# Patient Record
Sex: Male | Born: 1955 | Race: White | Hispanic: No | Marital: Married | Smoking: Former smoker
Health system: Southern US, Community
[De-identification: ages and names within clinical notes are randomized; demographics above are authoritative.]

## PROBLEM LIST (undated history)

## (undated) DIAGNOSIS — E119 Type 2 diabetes mellitus without complications: Secondary | ICD-10-CM

## (undated) DIAGNOSIS — R109 Unspecified abdominal pain: Secondary | ICD-10-CM

## (undated) DIAGNOSIS — G8929 Other chronic pain: Secondary | ICD-10-CM

## (undated) DIAGNOSIS — J449 Chronic obstructive pulmonary disease, unspecified: Secondary | ICD-10-CM

## (undated) DIAGNOSIS — K220 Achalasia of cardia: Secondary | ICD-10-CM

## (undated) DIAGNOSIS — E7800 Pure hypercholesterolemia, unspecified: Secondary | ICD-10-CM

## (undated) DIAGNOSIS — K219 Gastro-esophageal reflux disease without esophagitis: Secondary | ICD-10-CM

## (undated) DIAGNOSIS — F419 Anxiety disorder, unspecified: Secondary | ICD-10-CM

## (undated) DIAGNOSIS — IMO0002 Reserved for concepts with insufficient information to code with codable children: Secondary | ICD-10-CM

## (undated) HISTORY — DX: Achalasia of cardia: K22.0

## (undated) HISTORY — DX: Anxiety disorder, unspecified: F41.9

## (undated) HISTORY — DX: Chronic obstructive pulmonary disease, unspecified: J44.9

## (undated) HISTORY — DX: Unspecified abdominal pain: R10.9

## (undated) HISTORY — PX: HELLER MYOTOMY: SHX5259

## (undated) HISTORY — PX: ESOPHAGOGASTRODUODENOSCOPY: SHX1529

## (undated) HISTORY — PX: COLONOSCOPY: SHX174

## (undated) HISTORY — DX: Reserved for concepts with insufficient information to code with codable children: IMO0002

## (undated) HISTORY — DX: Gastro-esophageal reflux disease without esophagitis: K21.9

## (undated) HISTORY — DX: Other chronic pain: G89.29

## (undated) HISTORY — DX: Pure hypercholesterolemia, unspecified: E78.00

## (undated) HISTORY — PX: HEMORRHOID SURGERY: SHX153

---

## 2001-11-12 ENCOUNTER — Encounter: Payer: Self-pay | Admitting: Family Medicine

## 2001-11-12 ENCOUNTER — Ambulatory Visit (HOSPITAL_COMMUNITY): Admission: RE | Admit: 2001-11-12 | Discharge: 2001-11-12 | Payer: Self-pay | Admitting: Family Medicine

## 2004-06-29 ENCOUNTER — Ambulatory Visit (HOSPITAL_COMMUNITY): Admission: RE | Admit: 2004-06-29 | Discharge: 2004-06-29 | Payer: Self-pay | Admitting: Orthopedic Surgery

## 2007-07-20 ENCOUNTER — Emergency Department (HOSPITAL_COMMUNITY): Admission: EM | Admit: 2007-07-20 | Discharge: 2007-07-20 | Payer: Self-pay | Admitting: Emergency Medicine

## 2007-07-31 ENCOUNTER — Ambulatory Visit (HOSPITAL_COMMUNITY): Admission: RE | Admit: 2007-07-31 | Discharge: 2007-07-31 | Payer: Self-pay | Admitting: Family Medicine

## 2007-08-03 ENCOUNTER — Ambulatory Visit: Payer: Self-pay | Admitting: Urgent Care

## 2007-08-03 ENCOUNTER — Ambulatory Visit: Payer: Self-pay | Admitting: Cardiovascular Disease

## 2007-08-05 ENCOUNTER — Encounter: Payer: Self-pay | Admitting: Internal Medicine

## 2007-08-05 ENCOUNTER — Ambulatory Visit: Payer: Self-pay | Admitting: Cardiology

## 2007-08-05 ENCOUNTER — Observation Stay (HOSPITAL_COMMUNITY): Admission: EM | Admit: 2007-08-05 | Discharge: 2007-08-05 | Payer: Self-pay | Admitting: Internal Medicine

## 2007-08-06 ENCOUNTER — Inpatient Hospital Stay (HOSPITAL_COMMUNITY): Admission: EM | Admit: 2007-08-06 | Discharge: 2007-08-09 | Payer: Self-pay | Admitting: Emergency Medicine

## 2007-08-07 ENCOUNTER — Ambulatory Visit: Payer: Self-pay | Admitting: Internal Medicine

## 2007-08-08 ENCOUNTER — Ambulatory Visit: Payer: Self-pay | Admitting: Gastroenterology

## 2007-10-06 ENCOUNTER — Ambulatory Visit: Payer: Self-pay | Admitting: Gastroenterology

## 2007-11-26 ENCOUNTER — Ambulatory Visit: Payer: Self-pay | Admitting: Gastroenterology

## 2007-11-27 ENCOUNTER — Emergency Department (HOSPITAL_COMMUNITY): Admission: EM | Admit: 2007-11-27 | Discharge: 2007-11-27 | Payer: Self-pay | Admitting: Emergency Medicine

## 2007-12-02 ENCOUNTER — Ambulatory Visit: Payer: Self-pay | Admitting: Gastroenterology

## 2007-12-03 ENCOUNTER — Ambulatory Visit (HOSPITAL_COMMUNITY): Admission: RE | Admit: 2007-12-03 | Discharge: 2007-12-03 | Payer: Self-pay | Admitting: Gastroenterology

## 2007-12-03 ENCOUNTER — Ambulatory Visit: Payer: Self-pay | Admitting: Gastroenterology

## 2008-01-27 ENCOUNTER — Ambulatory Visit: Payer: Self-pay | Admitting: Gastroenterology

## 2008-02-22 ENCOUNTER — Ambulatory Visit (HOSPITAL_COMMUNITY): Admission: RE | Admit: 2008-02-22 | Discharge: 2008-02-22 | Payer: Self-pay | Admitting: Family Medicine

## 2008-02-28 ENCOUNTER — Emergency Department (HOSPITAL_COMMUNITY): Admission: EM | Admit: 2008-02-28 | Discharge: 2008-02-28 | Payer: Self-pay | Admitting: Emergency Medicine

## 2008-03-05 ENCOUNTER — Emergency Department (HOSPITAL_COMMUNITY): Admission: EM | Admit: 2008-03-05 | Discharge: 2008-03-05 | Payer: Self-pay | Admitting: Emergency Medicine

## 2008-03-10 ENCOUNTER — Ambulatory Visit: Payer: Self-pay | Admitting: Orthopedic Surgery

## 2008-03-10 DIAGNOSIS — C801 Malignant (primary) neoplasm, unspecified: Secondary | ICD-10-CM | POA: Insufficient documentation

## 2008-03-11 ENCOUNTER — Telehealth: Payer: Self-pay | Admitting: Orthopedic Surgery

## 2008-03-21 ENCOUNTER — Encounter: Payer: Self-pay | Admitting: Orthopedic Surgery

## 2008-03-24 ENCOUNTER — Encounter (INDEPENDENT_AMBULATORY_CARE_PROVIDER_SITE_OTHER): Payer: Self-pay | Admitting: Orthopedic Surgery

## 2008-03-24 ENCOUNTER — Ambulatory Visit (HOSPITAL_BASED_OUTPATIENT_CLINIC_OR_DEPARTMENT_OTHER): Admission: RE | Admit: 2008-03-24 | Discharge: 2008-03-24 | Payer: Self-pay | Admitting: Orthopedic Surgery

## 2008-03-31 ENCOUNTER — Encounter: Payer: Self-pay | Admitting: Orthopedic Surgery

## 2008-04-28 ENCOUNTER — Ambulatory Visit (HOSPITAL_COMMUNITY): Admission: RE | Admit: 2008-04-28 | Discharge: 2008-04-28 | Payer: Self-pay | Admitting: Orthopedic Surgery

## 2008-06-20 ENCOUNTER — Ambulatory Visit: Payer: Self-pay | Admitting: Gastroenterology

## 2008-06-21 ENCOUNTER — Ambulatory Visit (HOSPITAL_COMMUNITY): Admission: RE | Admit: 2008-06-21 | Discharge: 2008-06-21 | Payer: Self-pay | Admitting: Gastroenterology

## 2008-08-25 ENCOUNTER — Ambulatory Visit: Payer: Self-pay | Admitting: Internal Medicine

## 2008-08-25 ENCOUNTER — Encounter: Payer: Self-pay | Admitting: Urgent Care

## 2008-08-25 LAB — CONVERTED CEMR LAB
ALT: 24 units/L (ref 0–53)
Alkaline Phosphatase: 82 units/L (ref 39–117)
Basophils Absolute: 0.1 10*3/uL (ref 0.0–0.1)
Bilirubin, Direct: 0.1 mg/dL (ref 0.0–0.3)
Eosinophils Absolute: 0.5 10*3/uL (ref 0.0–0.7)
HCT: 42 % (ref 39.0–52.0)
Hemoglobin: 13.8 g/dL (ref 13.0–17.0)
Indirect Bilirubin: 0.6 mg/dL (ref 0.0–0.9)
Lymphocytes Relative: 25 % (ref 12–46)
Lymphs Abs: 1.3 10*3/uL (ref 0.7–4.0)
MCHC: 32.9 g/dL (ref 30.0–36.0)
MCV: 89.2 fL (ref 78.0–100.0)
Neutro Abs: 2.9 10*3/uL (ref 1.7–7.7)
Neutrophils Relative %: 58 % (ref 43–77)
Total Protein: 7.6 g/dL (ref 6.0–8.3)
WBC: 5.1 10*3/uL (ref 4.0–10.5)

## 2008-09-02 ENCOUNTER — Encounter (HOSPITAL_COMMUNITY): Admission: RE | Admit: 2008-09-02 | Discharge: 2008-10-02 | Payer: Self-pay | Admitting: Gastroenterology

## 2008-09-14 ENCOUNTER — Ambulatory Visit: Payer: Self-pay | Admitting: Gastroenterology

## 2008-09-14 ENCOUNTER — Ambulatory Visit (HOSPITAL_COMMUNITY): Admission: RE | Admit: 2008-09-14 | Discharge: 2008-09-14 | Payer: Self-pay | Admitting: Gastroenterology

## 2008-09-14 ENCOUNTER — Encounter: Payer: Self-pay | Admitting: Gastroenterology

## 2008-10-05 ENCOUNTER — Encounter (HOSPITAL_COMMUNITY): Admission: RE | Admit: 2008-10-05 | Discharge: 2008-11-04 | Payer: Self-pay | Admitting: Oncology

## 2008-10-05 ENCOUNTER — Ambulatory Visit (HOSPITAL_COMMUNITY): Payer: Self-pay | Admitting: Oncology

## 2008-10-07 ENCOUNTER — Ambulatory Visit (HOSPITAL_COMMUNITY): Admission: RE | Admit: 2008-10-07 | Discharge: 2008-10-07 | Payer: Self-pay | Admitting: Family Medicine

## 2008-12-07 ENCOUNTER — Encounter: Payer: Self-pay | Admitting: Gastroenterology

## 2008-12-19 ENCOUNTER — Encounter: Payer: Self-pay | Admitting: Gastroenterology

## 2009-02-21 ENCOUNTER — Encounter: Payer: Self-pay | Admitting: Gastroenterology

## 2009-03-07 DIAGNOSIS — K219 Gastro-esophageal reflux disease without esophagitis: Secondary | ICD-10-CM | POA: Insufficient documentation

## 2009-03-07 DIAGNOSIS — R109 Unspecified abdominal pain: Secondary | ICD-10-CM

## 2009-03-07 DIAGNOSIS — K2940 Chronic atrophic gastritis without bleeding: Secondary | ICD-10-CM | POA: Insufficient documentation

## 2009-03-07 DIAGNOSIS — D649 Anemia, unspecified: Secondary | ICD-10-CM

## 2009-03-07 DIAGNOSIS — R110 Nausea: Secondary | ICD-10-CM

## 2009-03-07 DIAGNOSIS — R1013 Epigastric pain: Secondary | ICD-10-CM

## 2009-04-20 ENCOUNTER — Ambulatory Visit: Payer: Self-pay | Admitting: Gastroenterology

## 2009-04-20 LAB — CONVERTED CEMR LAB
AST: 27 units/L (ref 0–37)
Albumin: 3.8 g/dL (ref 3.5–5.2)
Alkaline Phosphatase: 81 units/L (ref 39–117)
BUN: 16 mg/dL (ref 6–23)
Chloride: 107 meq/L (ref 96–112)
Creatinine, Ser: 1 mg/dL (ref 0.40–1.50)
Glucose, Bld: 94 mg/dL (ref 70–99)
Lipase: 34 units/L (ref 0–75)
Total Bilirubin: 0.4 mg/dL (ref 0.3–1.2)

## 2009-04-21 ENCOUNTER — Ambulatory Visit (HOSPITAL_COMMUNITY): Admission: RE | Admit: 2009-04-21 | Discharge: 2009-04-21 | Payer: Self-pay | Admitting: Gastroenterology

## 2009-04-21 ENCOUNTER — Telehealth: Payer: Self-pay | Admitting: Gastroenterology

## 2009-04-24 ENCOUNTER — Encounter (INDEPENDENT_AMBULATORY_CARE_PROVIDER_SITE_OTHER): Payer: Self-pay | Admitting: *Deleted

## 2009-04-25 ENCOUNTER — Encounter: Payer: Self-pay | Admitting: Gastroenterology

## 2009-04-25 ENCOUNTER — Ambulatory Visit (HOSPITAL_COMMUNITY): Admission: RE | Admit: 2009-04-25 | Discharge: 2009-04-25 | Payer: Self-pay | Admitting: Gastroenterology

## 2009-04-27 ENCOUNTER — Ambulatory Visit: Payer: Self-pay | Admitting: Gastroenterology

## 2009-04-27 ENCOUNTER — Telehealth: Payer: Self-pay | Admitting: Gastroenterology

## 2009-05-02 ENCOUNTER — Telehealth (INDEPENDENT_AMBULATORY_CARE_PROVIDER_SITE_OTHER): Payer: Self-pay

## 2009-11-28 ENCOUNTER — Encounter (INDEPENDENT_AMBULATORY_CARE_PROVIDER_SITE_OTHER): Payer: Self-pay

## 2010-01-01 ENCOUNTER — Encounter: Payer: Self-pay | Admitting: Urgent Care

## 2010-01-05 ENCOUNTER — Encounter (INDEPENDENT_AMBULATORY_CARE_PROVIDER_SITE_OTHER): Payer: Self-pay | Admitting: *Deleted

## 2010-04-30 ENCOUNTER — Emergency Department (HOSPITAL_COMMUNITY): Admission: EM | Admit: 2010-04-30 | Discharge: 2010-04-30 | Payer: Self-pay | Admitting: Emergency Medicine

## 2010-08-28 ENCOUNTER — Encounter: Payer: Self-pay | Admitting: Gastroenterology

## 2010-09-06 ENCOUNTER — Encounter (INDEPENDENT_AMBULATORY_CARE_PROVIDER_SITE_OTHER): Payer: Self-pay | Admitting: *Deleted

## 2010-09-25 NOTE — Letter (Signed)
Summary: Recall Colonoscopy/Endoscopy, Change to Office Visit  [facility]  [address]   [place], [place] [address]   Phone: [phone]  Fax: [phone]      November 28, 2009   Walter Herring [address] [place], [place]  [address] Feb 26, 1956   Dear [name],   According to our records, it is time for you to schedule an Endoscopy. However, after reviewing your medical record, we recommend an office visit in order to determine your need for a repeat procedure.  Please call [phone] at your convenience to schedule an office visit. If you have any questions or concerns, please feel free to contact our office.   Sincerely,   Cloria Spring LPN  [facility] Ph: [phone]   Fax: [phone]

## 2010-09-25 NOTE — Letter (Signed)
Summary: Recall Office Visit  [facility]  [address]   [place], [place] [address]   Phone: [phone]  Fax: [phone]      Jan 05, 2010   SHAROD PETSCH [address] [place], [place]  [address] 1956-05-05   Dear Mr. Fretz,   According to our records, it is time for you to schedule a follow-up office visit with [place].   At your convenience, please call [phone] to schedule an office visit. If you have any questions, concerns, or feel that this letter is in error, we would appreciate your call.   Sincerely,    Diana Eves  [facility] Ph: [phone]   Fax: [phone]

## 2010-09-25 NOTE — Medication Information (Signed)
Summary: RX Folder  RX Folder   Imported By: Peggyann Shoals 01/01/2010 14:38:01  _____________________________________________________________________  External Attachment:    Type:   Image     Comment:   External Document  Appended Document: RX Folder He is supposed to be on paroxetine per our records, unless changed elsewhere, this is correct.  He needs OV.  Appended Document: RX Folder mailed letter

## 2010-09-27 NOTE — Medication Information (Signed)
Summary: NAPROXEN  500MG   NAPROXEN  500MG    Imported By: Rexene Alberts 08/28/2010 12:23:32  _____________________________________________________________________  External Attachment:    Type:   Image     Comment:   External Document  Appended Document: NAPROXEN  500MG  please inform pharmacy to forward to PCP>   Appended Document: NAPROXEN  500MG  left message at pharmacy that PCP was Dr. [name]

## 2010-09-27 NOTE — Letter (Signed)
Summary: Recall Office Visit  [facility]  [address]   [place], [place] [address]   Phone: [phone]  Fax: [phone]      September 06, 2010   DAMARKO STITELY [address] [place], [place]  [address] 1956-05-09   Dear Mr. Younge,   According to our records, it is time for you to schedule a follow-up office visit with [place].   At your convenience, please call [phone] to schedule an office visit. If you have any questions, concerns, or feel that this letter is in error, we would appreciate your call.   Sincerely,    Diana Eves  [facility] Ph: [phone]   Fax: [phone]

## 2010-10-16 ENCOUNTER — Emergency Department (HOSPITAL_COMMUNITY): Payer: Medicare Other

## 2010-10-16 ENCOUNTER — Emergency Department (HOSPITAL_COMMUNITY)
Admission: EM | Admit: 2010-10-16 | Discharge: 2010-10-16 | Disposition: A | Discharge status: Home / Self Care | Payer: Medicare Other | Attending: Emergency Medicine | Admitting: Emergency Medicine

## 2010-10-16 DIAGNOSIS — M25559 Pain in unspecified hip: Secondary | ICD-10-CM | POA: Insufficient documentation

## 2010-10-16 DIAGNOSIS — M549 Dorsalgia, unspecified: Secondary | ICD-10-CM | POA: Insufficient documentation

## 2010-10-23 ENCOUNTER — Encounter: Payer: Self-pay | Admitting: Gastroenterology

## 2010-10-23 ENCOUNTER — Ambulatory Visit: Payer: Self-pay | Admitting: Gastroenterology

## 2010-10-23 ENCOUNTER — Encounter: Payer: Self-pay | Admitting: Internal Medicine

## 2010-10-23 ENCOUNTER — Ambulatory Visit (INDEPENDENT_AMBULATORY_CARE_PROVIDER_SITE_OTHER): Payer: Medicare Other | Admitting: Gastroenterology

## 2010-10-23 DIAGNOSIS — R1013 Epigastric pain: Secondary | ICD-10-CM

## 2010-10-23 DIAGNOSIS — R1319 Other dysphagia: Secondary | ICD-10-CM

## 2010-10-30 ENCOUNTER — Encounter: Payer: Medicare Other | Admitting: Internal Medicine

## 2010-10-30 ENCOUNTER — Ambulatory Visit (HOSPITAL_COMMUNITY)
Admission: RE | Admit: 2010-10-30 | Discharge: 2010-10-30 | Disposition: A | Discharge status: Home / Self Care | Payer: Medicare Other | Source: Ambulatory Visit | Attending: Internal Medicine | Admitting: Internal Medicine

## 2010-10-30 DIAGNOSIS — K2100 Gastro-esophageal reflux disease with esophagitis, without bleeding: Secondary | ICD-10-CM | POA: Insufficient documentation

## 2010-10-30 DIAGNOSIS — J4489 Other specified chronic obstructive pulmonary disease: Secondary | ICD-10-CM | POA: Insufficient documentation

## 2010-10-30 DIAGNOSIS — IMO0002 Reserved for concepts with insufficient information to code with codable children: Secondary | ICD-10-CM | POA: Insufficient documentation

## 2010-10-30 DIAGNOSIS — R1310 Dysphagia, unspecified: Secondary | ICD-10-CM

## 2010-10-30 DIAGNOSIS — J449 Chronic obstructive pulmonary disease, unspecified: Secondary | ICD-10-CM | POA: Insufficient documentation

## 2010-10-30 DIAGNOSIS — E785 Hyperlipidemia, unspecified: Secondary | ICD-10-CM | POA: Insufficient documentation

## 2010-10-30 DIAGNOSIS — T18108A Unspecified foreign body in esophagus causing other injury, initial encounter: Secondary | ICD-10-CM | POA: Insufficient documentation

## 2010-10-30 DIAGNOSIS — K222 Esophageal obstruction: Secondary | ICD-10-CM | POA: Insufficient documentation

## 2010-11-01 NOTE — Letter (Signed)
Summary: EGD/ED ORDER  EGD/ED ORDER   Imported By: Ave Filter 10/23/2010 14:35:17  _____________________________________________________________________  External Attachment:    Type:   Image     Comment:   External Document

## 2010-11-01 NOTE — Assessment & Plan Note (Signed)
Summary: VOMITING WHEN HE EATS/LAW   Vital Signs:  Patient profile:   55 year old male Height:      73 inches Weight:      190.25 pounds BMI:     25.19 Temp:     97.9 degrees F oral Pulse rate:   88 / minute BP sitting:   116 / 84  (left arm)  Vitals Entered By: Carolan Clines LPN (October 23, 2010 1:19 PM)  Visit Type:  Follow-up Visit Primary Care Provider:  Janna Herring, M.D.   History of Present Illness: Walter Herring is a 55 year old Caucasian male with a hx significant for achalasia, s/p Heller myotomy in December 2008. Last EGD in Jan 2010-->gastritis. Last visit with our office August 2010 with a wt of 208. Presents today with 18 lb wt loss, down to 190.    presents with a 6-7 mos hx of progressive dysphagia and epigastric pain with eating. Described as "busting chest open". Occasional nausea. Only relief is to regurgitate food.  Reports several  incidences of melena over the past few months. Also has noted small amount of bright red blood with regurgitation on several occasions. Med list reports Naproxen, but pt is unsure if he is taking this or not.  Taking Goodys every day X 2-3 months secondary to epigastric pain. Hasn't been taking omeprazole as prescribed because didn't have refills.  Decreased appetite due to epigastric pain.  Current Medications (verified): 1)  Hydrocodone .... As Needed 2)  Promethazine Hcl 25 Mg Tabs (Promethazine Hcl) .... 1/2 To One By Mouth Every 8 Hours As Needed 3)  Norco 7.5-325 Mg  Tabs (Hydrocodone-Acetaminophen) .Marland Kitchen.. 1 Q 4 As Needed 4)  Omeprazole 20 Mg Cpdr (Omeprazole) .... One By Mouth Two Times A Day Before Breakfast and Evening Meal 5)  Naproxen 500 Mg Tabs (Naproxen) .... Take 1 Tablet By Mouth Two Times A Day 6)  Multivitamins  Tabs (Multiple Vitamin) .... Take 1 Tablet By Mouth Once A Day 7)  Pravachol 40 Mg Tabs (Pravastatin Sodium) .... Take 1 Tablet By Mouth Once A Day 8)  Aciphex .... Samples/ When Not Taking Omeprazole 9)   Lidocaine Viscous 2 % Soln (Lidocaine Hcl) .Marland Kitchen.. 10 Cc With 200 Cc Maalox or Mylanta Q4-6h As Needed Pain 10)  Fluoxetine Hcl 20 Mg Caps (Fluoxetine Hcl) .... Take 2 By Mouth Daily 11)  Omeprazole 20 Mg Tbec (Omeprazole) .... Take 1 30 Minutes Before Breakfast and 30 Minutes Before Dinner  Allergies (verified): No Known Drug Allergies  Past History:  Past Medical History: bulging disc in back DDD GERD COPD Hypercholesterolemia Anxiety  Past Surgical History: Heller Myotomy EGD/ Elease Hashimoto dilation 2008 EGD Jan 2010-->gastritis  hemorrhoidectomy Teeth extraction 2009  TCS 2009-NORMAL  Family History: Reviewed history from 04/20/2009 and no changes required. FH of Cancer:  Family History Coronary Heart Disease male < 21 Family History of Arthritis No FH of Colon Cancer or polyps  Social History: Patient is married X 20+ years disabled Chews tobacco, no smoking X 15 years Alcohol Use - no  Review of Systems General:  Denies fever, chills, and anorexia. Eyes:  Denies blurring, irritation, and discharge. ENT:  Denies sore throat, hoarseness, and difficulty swallowing. CV:  Denies chest pains and syncope. Resp:  Denies dyspnea at rest and wheezing. GI:  See HPI. GU:  Denies urinary burning and urinary frequency. MS:  Denies joint pain / LOM, joint swelling, and joint stiffness. Derm:  Denies rash, itching, and dry skin. Neuro:  Denies weakness  and syncope. Psych:  Denies depression and anxiety. Endo:  Denies cold intolerance and heat intolerance. Heme:  Denies bruising and bleeding.  Physical Exam  General:  Well developed, well nourished, no acute distress. Head:  Normocephalic and atraumatic. Eyes:  PERRLA, no icterus. Lungs:  Clear throughout to auscultation. Heart:  Regular rate and rhythm; no murmurs, rubs,  or bruits. Abdomen:  +BS, soft, non-tender, non-distended. no HSM, no rebound or guarding Msk:  Symmetrical with no gross deformities. Normal  posture. Neurologic:  Alert and  oriented x4;  grossly normal neurologically. Skin:  Intact without significant lesions or rashes. Psych:  Alert and cooperative. Normal mood and affect.   Impression & Recommendations:  Problem # 1:  EPIGASTRIC PAIN (ICD-64.70) 55 year old Caucasian male with significant hx of achalasia, s/p Heller myotomy in Dec 2008, hx of gastritis on EGD 2010. Now presents with 6-7 mos hx of progressive dysphagia and epigastric pain with eating, feels like "busting chest open". Occasional nausea. Only relief is regurgitating food. +melena over last few months, small amount of brb with emesis on several occasions. Unsure if taking naproxen, but does report goody's powders every day for past several months. No PPI, ran out of refills. 18 lb wt loss since Aug 2010. question gastritis vs PUD secondary to goodys, warrants further investigation endoscopically due to hx.  EGD/ED with Dr. Jena Gauss in near future: the R/B/A have been discussed in detail with pt; he desires to proceed and states understanding. Resume Prilosec 20 mg one 30 minutes before breakfast and 30 before dinner CBC for baseline F/U after EGD  T-CBC w/Diff ([id number])  Problem # 2:  DYSPHAGIA (FAO-130.86)  See # 1  Orders: Est. Patient Level II (57846) Prescriptions: OMEPRAZOLE 20 MG TBEC (OMEPRAZOLE) take 1 30 minutes before breakfast and 30 minutes before dinner  #62 x 1   Entered and Authorized by:   Gerrit Halls NP   Signed by:   Gerrit Halls NP on 10/23/2010   Method used:   Faxed to ...       [facility] (retail)       [address]       [place], [place]  [address]       Ph: [phone]       Fax: [phone]   RxID:   [id number]    Orders Added: 1)  T-CBC w/Diff [[id number]] 2)  Est. Patient Level II [66063]

## 2010-11-05 NOTE — Op Note (Signed)
NAME:  Walter Herring, Walter Herring                ACCOUNT NO.:  [id number]  MEDICAL RECORD NO.:  [id number]           PATIENT TYPE:  O  LOCATION:  DAYP                          FACILITY:  [facility]  PHYSICIAN:  [name], M.D. DATE OF BIRTH:  04-23-1956  DATE OF PROCEDURE:  10/30/2010 DATE OF DISCHARGE:                              OPERATIVE REPORT   INDICATIONS FOR PROCEDURE:  A 55 year old gentleman with history of achalasia, status post esophagomyotomy and antireflux procedure with progressive esophageal dysphagia to solids, some liquids and retrosternal chest discomfort only recently got back on acid suppression with omeprazole 20 mg orally daily.  EGD is now being done potential for esophageal dilation, etc.  I reviewed risks, benefits, limitations, alternatives and imponderables have been discussed, questions answered. Please see the documentation in the medical record.  PROCEDURE NOTE:  O2 saturation, blood pressure, pulse and respirationswere monitored throughout the entire procedure.  CONSCIOUS SEDATION: 1. Versed 7 mg IV. 2. Demerol 125 mg IV in divided doses.  INSTRUMENT:  Pentax video chip system.  FINDINGS:  Examination of the tubular esophagus revealed a piece of meat or other type of food ball down in the distal esophagus.  I brought the tip of the scope in line with this food bolus and gently applied pressure and pushed it into the stomach.  There appeared to be a peptic stricture overlying distal esophageal erosions, some resistance in the passage of the diagnostic gastroscope.  There was no Barrett's esophagus or tumor.  EG junction easily traversed.  Stomach:  The stomach was otherwise empty and insufflated well with air.  Thorough examination of the gastric mucosa including retroflexed view of the proximal stomach, esophagogastric junction demonstrated evidence of prior antireflux surgery only.  Pylorus was patent, easily traversed.  Examination of the bulb and second  portion revealed no abnormalities.  THERAPEUTIC/DIAGNOSTIC MANEUVERS PERFORMED:  Scope was withdrawn.  I attempted to pass a 54-French Maloney dilator, but ran into acute resistance upon three-quarters insertion through the dilator and obtained a graduated TTS balloon and located it across the stricture and inflated to 18-mm and subsequently 20-mm held across the area of stricturing for 1 minute and took it down.  This did result in some dilation of the stricture.  Minimal bleeding without apparent complication.  The patient tolerated the procedure well and was reacted to Endoscopy.  IMPRESSION: 1. Esophageal food impaction, status post removal as described above. 2. Distal erosive reflux esophagitis with superimposed peptic     stricture, status post dilation described above. 3. Evidence of prior antireflux surgery, otherwise normal stomach,     patent pylorus, normal D1 and D2.  RECOMMENDATIONS: 1. Increase omeprazole to 20 mg orally twice daily. 2. Followup appointment with [place] in 6 weeks to reassess.  He may need a     subsequent dilation. 3. Soft diet. 4. Literature on gastroesophageal reflux provided to [name].     Jonathon Bellows, M.D.     RMR/MEDQ  D:  10/30/2010  T:  10/30/2010  Job:  [id number]  cc:   Melvyn Novas, MD Fax: [phone]  Electronically Signed by R.  Jena Gauss M.D. on 11/05/2010 10:27:22 AM

## 2010-11-06 ENCOUNTER — Encounter: Payer: Self-pay | Admitting: Gastroenterology

## 2010-11-06 LAB — CONVERTED CEMR LAB
Basophils Absolute: 0 10*3/uL (ref 0.0–0.1)
Eosinophils Relative: 6 % — ABNORMAL HIGH (ref 0–5)
HCT: 41.8 % (ref 39.0–52.0)
Lymphs Abs: 1.4 10*3/uL (ref 0.7–4.0)
MCHC: 33.5 g/dL (ref 30.0–36.0)
MCV: 87.8 fL (ref 78.0–100.0)
Neutrophils Relative %: 57 % (ref 43–77)

## 2010-11-13 ENCOUNTER — Other Ambulatory Visit: Payer: Self-pay | Admitting: Neurological Surgery

## 2010-11-13 DIAGNOSIS — G060 Intracranial abscess and granuloma: Secondary | ICD-10-CM

## 2010-11-13 DIAGNOSIS — M5410 Radiculopathy, site unspecified: Secondary | ICD-10-CM

## 2010-11-13 DIAGNOSIS — M545 Low back pain: Secondary | ICD-10-CM

## 2010-11-15 ENCOUNTER — Other Ambulatory Visit: Payer: Medicare Other

## 2010-11-15 ENCOUNTER — Inpatient Hospital Stay: Admission: RE | Admit: 2010-11-15 | Payer: Medicare Other | Source: Ambulatory Visit

## 2010-11-21 ENCOUNTER — Ambulatory Visit
Admission: RE | Admit: 2010-11-21 | Discharge: 2010-11-21 | Disposition: A | Discharge status: Home / Self Care | Payer: Medicare Other | Source: Ambulatory Visit | Attending: Neurological Surgery | Admitting: Neurological Surgery

## 2010-11-21 DIAGNOSIS — M545 Low back pain: Secondary | ICD-10-CM

## 2010-11-21 DIAGNOSIS — G060 Intracranial abscess and granuloma: Secondary | ICD-10-CM

## 2010-11-21 DIAGNOSIS — M5410 Radiculopathy, site unspecified: Secondary | ICD-10-CM

## 2010-11-23 ENCOUNTER — Other Ambulatory Visit: Payer: Self-pay

## 2010-11-23 MED ORDER — FLUOXETINE HCL 20 MG PO CAPS: 40.0000 mg | ORAL_CAPSULE | Freq: Every day | ORAL | Status: DC

## 2010-12-06 ENCOUNTER — Encounter: Payer: Self-pay | Admitting: Gastroenterology

## 2010-12-06 ENCOUNTER — Ambulatory Visit (INDEPENDENT_AMBULATORY_CARE_PROVIDER_SITE_OTHER): Payer: Medicare Other | Admitting: Gastroenterology

## 2010-12-06 VITALS — BP 123/84 | HR 86 | Temp 98.1°F | Ht 73.0 in | Wt 186.8 lb

## 2010-12-06 DIAGNOSIS — K219 Gastro-esophageal reflux disease without esophagitis: Secondary | ICD-10-CM

## 2010-12-06 DIAGNOSIS — R109 Unspecified abdominal pain: Secondary | ICD-10-CM

## 2010-12-06 DIAGNOSIS — R079 Chest pain, unspecified: Secondary | ICD-10-CM

## 2010-12-06 DIAGNOSIS — R1319 Other dysphagia: Secondary | ICD-10-CM

## 2010-12-06 DIAGNOSIS — K222 Esophageal obstruction: Secondary | ICD-10-CM

## 2010-12-06 DIAGNOSIS — K220 Achalasia of cardia: Secondary | ICD-10-CM

## 2010-12-06 DIAGNOSIS — R1310 Dysphagia, unspecified: Secondary | ICD-10-CM

## 2010-12-06 MED ORDER — ESOMEPRAZOLE MAGNESIUM 40 MG PO PACK: PACK | ORAL | Status: DC

## 2010-12-06 NOTE — Patient Instructions (Signed)
TAKE NEXIUM 30 MINUTES BEFORE MEALS TWICE DAILY IF POSSIBLE. WE WILL BEGIN PAPERWORK TO GET NEXIUM FOR FREE FROM THE DRUG COMPANY. FOLLOW A SOFT MECHANICAL DIET. YOU HAVE BEEN REFERRED TO THE PAIN CLINIC TO MANAGE YOUR CHRONIC PAIN ISSUES.  FOLLOW UP IN 4 MONTHS.

## 2010-12-06 NOTE — Progress Notes (Signed)
  Subjective:    Patient ID: Walter Herring, male    DOB: Jul 25, 1956, 55 y.o.   MRN: [id number]  HPI Currently having problems swallowing sometimes every day. Takes one bite and it hurts. Can hurt with water or solids. Pain: mid chest and into neck-every day, sometimes several times a day. Can't afford a Nexium rx. Takes it once a day when he has it. Doesn't eat that much. Can eat 3-4 times a day. Vomits: 2x/day-occasionally it's been about every day. Gets in chest and things won't go down or come out. Stretching by Dr. Jena Gauss did provide relief and not having pain and swallowing better. Sedation good.   Past Medical History  Diagnosis Date  . DDD (degenerative disc disease)   . GERD (gastroesophageal reflux disease)   . COPD (chronic obstructive pulmonary disease)   . Hypercholesterolemia   . Anxiety   . Achalasia 2008 168 LBS   Past Surgical History  Procedure Date  . 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 CARL WESTCOTT  . Esophagogastroduodenoscopy      MAR 2012 (RMR) TTS DIL 20 MM, JAN 2010 Gastritis  . Hemorrhoid surgery   . Colonoscopy 2009 [facility]    Normal      Review of Systems 2009 170 lbs    Objective:   Physical Exam  Constitutional: He is oriented to person, place, and time. He appears well-developed and well-nourished. No distress.  HENT:  Head: Normocephalic and atraumatic.  Mouth/Throat: Oropharynx is clear and moist.  Eyes: Pupils are equal, round, and reactive to light.  Neck: Normal range of motion. Neck supple.  Cardiovascular: Normal rate and regular rhythm.   Pulmonary/Chest: Effort normal. He has rales.       At the bases bilaterally  Abdominal: Soft. Bowel sounds are normal. There is tenderness. There is no rebound and no guarding.       Mild TTP in epigastrium and suprapubic region  Musculoskeletal: He exhibits no edema.  Neurological: He is alert and oriented to person, place, and time.  Psychiatric:       FLAT AFFECT          Assessment & Plan:

## 2010-12-06 NOTE — Progress Notes (Signed)
Pt is aware of OV on 04/10/11 @ 0945 with SF

## 2010-12-11 LAB — DIFFERENTIAL
Basophils Absolute: 0 10*3/uL (ref 0.0–0.1)
Eosinophils Relative: 11 % — ABNORMAL HIGH (ref 0–5)

## 2010-12-11 LAB — CBC
HCT: 39.9 % (ref 39.0–52.0)
Hemoglobin: 14 g/dL (ref 13.0–17.0)
RDW: 13 % (ref 11.5–15.5)

## 2010-12-12 ENCOUNTER — Ambulatory Visit: Payer: Medicare Other | Admitting: Gastroenterology

## 2010-12-19 ENCOUNTER — Encounter: Payer: Self-pay | Admitting: Gastroenterology

## 2010-12-19 DIAGNOSIS — R079 Chest pain, unspecified: Secondary | ICD-10-CM | POA: Insufficient documentation

## 2010-12-19 NOTE — Assessment & Plan Note (Signed)
Sx not ideally controlled because pt can't afford meds.  NEXIUM 30 MINUTES BEFORE MEALS TWICE DAILY IF POSSIBLE. WILL BEGIN PAPERWORK TO GET NEXIUM FOR FREE FROM THE DRUG COMPANY. FOLLOW UP IN 4 MONTHS.

## 2010-12-19 NOTE — Assessment & Plan Note (Signed)
Most likely 2o to Erosive Esophagitis and functional abd pain.  NEXIUM 30 MINUTES BEFORE MEALS TWICE DAILY IF POSSIBLE. REFERRED TO THE PAIN CLINIC TO MANAGE YOUR CHRONIC PAIN ISSUES.  FOLLOW UP IN 4 MONTHS.

## 2010-12-19 NOTE — Assessment & Plan Note (Signed)
IMPROVED.   FOLLOW A SOFT MECHANICAL DIET.

## 2010-12-19 NOTE — Progress Notes (Signed)
Cc to PCP 

## 2010-12-19 NOTE — Assessment & Plan Note (Signed)
2o TO EROSIVE ESOPHAGITIS.  TAKE NEXIUM 30 MINUTES BEFORE MEALS TWICE DAILY IF POSSIBLE. WILL BEGIN PAPERWORK TO GET NEXIUM FOR FREE FROM THE DRUG COMPANY. FOLLOW A SOFT MECHANICAL DIET. REFERRED TO THE PAIN CLINIC TO MANAGE YOUR CHRONIC PAIN ISSUES.  FOLLOW UP IN 4 MONTHS.

## 2011-01-08 NOTE — Consult Note (Signed)
NAMEMACKY, GALIK NO.:  [id number]   MEDICAL RECORD NO.:  [id number]          PATIENT TYPE:  INP   LOCATION:  A212                          FACILITY:  [facility]   PHYSICIAN:  Tilford Pillar, MD      DATE OF BIRTH:  08/11/1956   DATE OF CONSULTATION:  08/08/2007  DATE OF DISCHARGE:                                 CONSULTATION   REFERRING SERVICE:  [facility].   REASON FOR CONSULTATION:  History of achalasia requiring intravenous  nutritional support.   HISTORY OF PRESENT ILLNESS:  The patient is an unfortunate 55 year old  male in otherwise relatively good health, who has a longstanding history  of nausea and vomiting and chest pain.  With additional workup, he was  diagnosed with achalasia.  At this point, he is unable to tolerate any  oral nutritional support and it was recommended by his primary admitting  physician that he start a course of paternal nutrition while awaiting  his transfer to [facility] for definitive care of his achalasia.  He is  currently in no acute distress.  He does have some slight nausea  currently.  He denies any previous surgeries on his shoulders or neck.   PAST MEDICAL HISTORY:  No history of bleeding diatheses.   PAST SURGICAL HISTORY:  As above mentioned, no neck or shoulder  surgeries.   MEDICATIONS:  Medications were reviewed.  He is currently not on any  anticoagulation medication.   ALLERGIES:  No known drug allergies.   PHYSICAL EXAMINATION:  GENERAL:  On initial evaluation, he is in no  acute distress.  He is alert and oriented.  He is a well-developed,  somewhat thin male.  HEENT:  His pupils are equal and round.  Extraocular movements are  intact.  Oral mucosa is pink and moist.  NECK:  No cervical lymphadenopathy.  No scars are noted on the neck or  shoulders.  CARDIOVASCULAR:  Carotids appears to be symmetrical bilaterally.  Distal  pulses are 2+ in bilateral radial arteries.  No upper extremity edema is  noted.  He does have a small-gauge peripheral IV currently.   ASSESSMENT AND PLAN:  Malnutrition requiring paternal nutrition.  Risks,  benefits and alternatives of a central catheter placement were discussed  at length with the patient and the patient's questions and concerns were  addressed and answered including planned procedure.  we will proceed  with planned central venous catheter placement.   I appreciate the opportunity to participate in the patient's care.      Tilford Pillar, MD  Electronically Signed     BZ/MEDQ  D:  08/08/2007  T:  08/09/2007  Job:  [id number]   cc:   Angus G. Renard Matter, MD  Fax: [phone]

## 2011-01-08 NOTE — Group Therapy Note (Signed)
NAMEKNOLAN, SIMIEN                ACCOUNT NO.:  [id number]   MEDICAL RECORD NO.:  [id number]          PATIENT TYPE:  INP   LOCATION:  A212                          FACILITY:  [facility]   PHYSICIAN:  Dorris Singh, DO    DATE OF BIRTH:  [birth year]   DATE OF PROCEDURE:  DATE OF DISCHARGE:                                 PROGRESS NOTE   The patient seen this morning.  Has no complaints.  Awaiting his EGD.   VITAL SIGNS:  Blood pressure 103/62.  GENERAL:  This is a 55 year old male who is well-developed, well-  nourished in no acute distress.  HEART:  Regular rate and rhythm.  LUNGS:  Clear to auscultation bilaterally.  ABDOMEN:  Soft, nontender, nondistended.  EXTREMITIES:  Positive pulses.   His CBC and chemistries are all within normal limits.   ASSESSMENT AND PLAN:  1. Nausea and vomiting.  2. Dysphagia.  3. Possible achalasia.   PLAN:  Await recommendations from GI and will determine if the patient  can be discharged today.  If not will follow their recommendations.      Dorris Singh, DO  Electronically Signed     CB/MEDQ  D:  08/07/2007  T:  08/07/2007  Job:  [id number]

## 2011-01-08 NOTE — Assessment & Plan Note (Signed)
NAME:  BRAYAN, VOTAW                 CHART#:  [id number]   DATE:  10/06/2007                       DOB:  1956/06/09   DATE OF SERVICE:  October 06, 2007.   REFERRING Draysen Weygandt:  [facility] Department.   PRIMARY SURGEON:  Dr. Francee Gentile.   PROBLEM:  1. Achalasia, status post laparoscopic Heller myotomy in December      2008.  2. Left nephrolithiasis.  3. Chronic back pain.   SUBJECTIVE:  [name] is a 55 year old male who presents as a return  patient visit.  He states his swallowing is a whole lot better.  He gets  nauseated 2 times a day.  Sometimes when he is sleeping at nighttime,  food comes back up into the back of his throat.  He uses Zantac which  has helped.  He has follow up with Dr. Carolynn Sayers within the next month.  The pain in his chest is better.  He denies any abdominal pain.  He may  vomit 1-2 times a day if he eats too much.   MEDICATIONS:  1. Ranitidine 300 mg daily.  2. Phenergan as needed.  3. Hydrocodone as needed.   OBJECTIVE:  VITAL SIGNS:  Weight 170 pounds (unchanged since December  2008), height 6 feet 1 inch, BMI 22.4 (healthy), temperature 97.6, blood  pressure 110/70, pulse 60.GENERAL:  He is in no apparent distress.  Alert and oriented x 4.  LUNGS:  Clear to auscultation  bilaterally.CARDIOVASCULAR:  Regular rhythm, no murmurs. ABDOMEN:  Bowel  sounds are present, soft, nontender and nondistended with incisions that  appear to be well healed.  He still has a right flank incision that has  mild erythema around it.   ASSESSMENT:  Mr. Montrose is a 55 year old male with achalasia who is  status post laparoscopic Heller myotomy who continues to have some  degree of regurgitation.  His tolerance of food is much improved.   Thank you for allowing me to see Mr. Beeks in consultation.  My  recommendations follow.   RECOMMENDATIONS:  1. Would consider a screening EGD in 2-3 years due to his history of      achalasia.  He is at slightly  increased risk for developing      esophageal cancer.  2. He is asked not to eat 3 hours before lying down.  3. He is to follow the instructions on the [facility] handout.  4. He can continue to use Zantac 2 p.o. q.h.s.  He is given 30 with 5      refills.  5. I did give him some Ultracet, #30 and he can take 2 p.o. every 4-6      h. as needed for pain, maximum 8 per day.  6. He is asked to follow up with his primary Nathaneil Feagans for continued      pain medicine prescriptions.  7. Will check his hepatic function panel to evaluate his abdomen at      this point.  8. He has a return patient visit in 4 months and we will reassess his      weight and nutritional status at that time.       Kassie Mends, M.D.  Electronically Signed     SM/MEDQ  D:  10/07/2007  T:  10/08/2007  Job:  [id number]  cc:   Health Department [facility]

## 2011-01-08 NOTE — Op Note (Signed)
Walter Herring, Walter Herring                ACCOUNT NO.:  [id number]   MEDICAL RECORD NO.:  [id number]          PATIENT TYPE:  AMB   LOCATION:  DAY                           FACILITY:  [facility]   PHYSICIAN:  Kassie Mends, M.D.      DATE OF BIRTH:  1956-04-06   DATE OF PROCEDURE:  09/14/2008  DATE OF DISCHARGE:                               OPERATIVE REPORT   REFERRING PHYSICIAN:  Melvyn Novas, MD   PROCEDURE:  Esophagogastroduodenoscopy with cold forceps biopsy of the  gastric and duodenal mucosa to evaluate for eosinophilic  gastroenteritis.   INDICATION FOR EXAM:  Mr. Phegley is a 55 year old male with achalasia.  He had a Heller myotomy in December 2008.  He has been able to maintain  his weight.  His lab evaluation revealed a hemoglobin of 13.8 and a  normal albumin.  The CBC with diff showed a mild peripheral eosinophilia  with 10% eosinophils.  The EGD is being done to evaluate for  eosinophilic gastroenteritis or H. pylori gastritis, which can be  associated with mild peripheral eosinophilia.   FINDINGS:  1. Dilated esophagus with small amount of retained liquid contents.      The scope passed easily through the GE junction.  No evidence of      Barrett, mass, erosions, ulcerations, or strictures seen.  2. Mild erythema in the body of the stomach.  Biopsies obtained via      cold forceps to evaluate for H. pylori gastritis or eosinophilic      gastritis.  3. Normal duodenal bulb and second portion of the duodenum.  Biopsies      obtained via cold forceps to evaluate for eosinophilic duodenitis.   DIAGNOSES:  1. No obvious source for peripheral eosinophilia identified.  2. Mild gastritis may contribute to abdominal pain.  No source for      nausea identified.   RECOMMENDATIONS:  1. Will call Mr. Skilling with the results of his biopsies.  If his      biopsies show no etiology for his peripheral eosinophilia, then we      will refer him to Dr. Mariel Sleet.  2. No aspirin,  NSAIDs, or anticoagulation for 5 days.  3. He may resume his previous diet.  4. If his nausea persists then would order a gastric emptying study.      He may continue to use Phenergan as needed.  5. Will schedule a follow up after the results of the biopsies are      known.   MEDICATIONS:  1. Demerol 100 mg IV.  2. Versed 4 mg IV.  3. Phenergan 12.5 mg IV.   PROCEDURE TECHNIQUE:  Physical exam was performed.  Informed consent was  obtained from the patient after explaining the benefits, risks, and  alternatives to the procedure.  The patient was connected to monitor and  placed in left lateral position.  Continuous oxygen was provided by  nasal cannula.  IV medicine administered through an indwelling cannula.  After administration of sedation, the patient's esophagus was intubated  and the scope was advanced under direct visualization  to the second  portion of the duodenum.  The scope was removed slowly by carefully  examining the color, texture, anatomy, and integrity of the mucosa on  the way out.  The patient was recovered in endoscopy and discharged home  in satisfactory condition.   PATH:  Chronic gastritis. Nl duodenum. [facility] referral.  OPV W/ SLM  EA:[id number], 6/10.      Kassie Mends, M.D.  Electronically Signed     SM/MEDQ  D:  09/14/2008  T:  09/14/2008  Job:  [id number]   cc:   Melvyn Novas, MD  Fax: [phone]

## 2011-01-08 NOTE — Op Note (Signed)
NAMEMARINO, Walter                ACCOUNT NO.:  [id number]   MEDICAL RECORD NO.:  [id number]          PATIENT TYPE:  INP   LOCATION:  A212                          FACILITY:  [facility]   PHYSICIAN:  [name], M.D. DATE OF BIRTH:  1955-09-13   DATE OF PROCEDURE:  08/06/2007  DATE OF DISCHARGE:                               OPERATIVE REPORT   PROCEDURE:  EGD with Elease Hashimoto dilation.   INDICATIONS FOR PROCEDURE:  The patient is a 55 year old gentleman with  esophageal dysphagia to solids and liquids.  Barium esophagram  suspicious for achalasia.  He has developed acutely worsening dysphagia  to solids and liquids for which he was admitted.  EGD is now being done.  This approach has been discussed with the patient at length.  The  potential risks, benefits, and alternatives have been reviewed and  questions answered.  He is agreeable.  Please see the documentation in  the medical record.   PROCEDURE:  O2 saturation, blood pressure, pulse, and respirations were  monitored throughout the entire procedure.  Conscious sedation:  Versed  4 mg IV, Demerol 50 mg IV in divided doses.  Instrument:  Pentax video  chip system.   FINDINGS:  Examination of the tubular esophagus revealed a relatively  baggy atonic esophagus with a somewhat dilated distal third.  The LES  had an elastic appearance and feel to it.  It was easily traversed  with the scope.   Stomach:  The gastric cavity was empty and insufflated well with air.  Thorough examination of the gastric mucosa, including retroflex view of  the proximal stomach and esophagogastric junction, demonstrated a couple  of tiny antral erosions.  Otherwise, the gastric mucosa appeared normal.  There was no hiatal hernia.  The stomach appeared normal, as stated,  particularly on retroflexion.  The pylorus was patent and easily  traversed.  Examination of the bulb and second portion revealed no  abnormalities.   THERAPEUTIC/DIAGNOSTIC MANEUVERS:   The scope was withdrawn.  A 58-French  Maloney dilator was passed fully with ease.  Subsequently, a 60-French  Maloney dilator was passed to full insertion with ease.  A look back  revealed a superficial tear through the LES.  No apparent complications  related to passage of the dilator.  The patient tolerated the procedure  well and was reactive in endoscopy.   IMPRESSION:  1. Baggy atonic, somewhat dilated esophagus with an elastic lower      esophageal sphincter suspicious for achalasia, status post      dilatation as described above.  2. Otherwise normal stomach aside from tiny antral erosions.  Patent      pylorus and first and second portions of the duodenum.   RECOMMENDATIONS:  1. Full liquid diet.  2. If he does have achalasia, any benefit from esophageal dilation      today will be short-lived.  If he tolerates a full liquid diet, we      will set him up for an outpatient manometry.  If this is consistent      with achalasia, then he will be sent  for surgical consultation for      definitive treatment of achalasia.      Jonathon Bellows, M.D.  Electronically Signed     RMR/MEDQ  D:  08/07/2007  T:  08/09/2007  Job:  [id number]   cc:   Angus G. Renard Matter, MD  Fax: [phone]

## 2011-01-08 NOTE — Consult Note (Signed)
[name], [name]                ACCOUNT NO.:  [id number]   MEDICAL RECORD NO.:  [id number]          PATIENT TYPE:  OBV   LOCATION:  A208                          FACILITY:  [facility]   PHYSICIAN:  Noralyn Pick. Eden Emms, MD, FACCDATE OF BIRTH:  09/26/1955   DATE OF CONSULTATION:  08/05/2007  DATE OF DISCHARGE:                                 CONSULTATION   PRIMARY CARE PHYSICIAN:  Currently, the patient has no primary care  physician.   CARDIOLOGIST:  He will be new to Dr. Charlton Haws.   REASON FOR REQUEST:  Chest pain.   HISTORY OF PRESENT ILLNESS:  [name] is a 55 year old male patient who  was initially seen by gastroenterology secondary to dysphagia.  He has a  history of esophageal stricture status post dilatation.  He presented as  an outpatient for elective EGD.  Before the procedure started, he noted  significant chest pain recently, and he was admitted by the hospitalist  team.  We are asked to further evaluate.   The patient notes a history of chest pain in the past, but over the last  two months it has progressively gotten worse.  He injured his back some  years ago and is not very active.  He is currently on disability.  With  just minimal exertion, he can develop significant 10/10 epigastric and  substernal chest pain that he describes as a pressure.  It radiates up  to his right jaw, as well as down both arms.  he notes significant  associated shortness of breath, as well as nausea and diaphoresis.  The  pain lasts even into rest, and he sometimes has to take a pain tablet to  make it go away.  The pain also sometimes occurs at rest.  It will often  awaken him from sleep.  This happened just prior to his EGD appointment.  It also happened again last night.  Since hospitalized, his cardiac  markers have been negative x3.  His EKG has been normal x2.  He notes  dyspnea with exertion that he describes as NYHA Class II B.  He sleeps  on two pillows at times, secondary to  shortness of breath but denies any  PND or pedal edema.  He denies any syncope but has had some near syncope  with just bending over but not associated with his chest pain.   PAST MEDICAL HISTORY:  He denies any history of CAD, stroke, diabetes  mellitus, hypertension or hyperlipidemia.  His past medical history is  positive for:  1. Degenerative disk disease.      a.     Chronic low back pain.  2. GERD.      a.     Esophageal stricture.      b.     History of debilitation in the past.      c.     Recent GI series concerning for achalasia - EGD pending.  3. History of hemorrhoid surgery.   HOME MEDICATIONS:  Medications at home include:  1. Tramadol/APAP p.r.n.  2. Methocarbamol p.r.n.   CURRENT MEDICATIONS:  Include:  1. Protonix 40 mg daily.  2. Lovenox 40 mg subcu q. day.  3. Aspirin 81 mg daily.   ALLERGIES:  NO KNOWN DRUG ALLERGIES.   SOCIAL HISTORY:  The patient lives in [place].  He is married, has two  children.  He has a 35 to 40-pack year history of smoking.  He quit  smoking about 10 years ago.  Denies alcohol or drug abuse.  Again, he is  disabled secondary to degenerative disk disease.   FAMILY HISTORY:  Significant for CAD.  He has a brother who had bypass  surgery in his 13s.  Both parents had heart trouble, but he is unsure of  what type.  His dad died from complications with lung carcinoma at age  15.   REVIEW OF SYSTEMS:  Please HPI.  He denies fevers, chills, dysuria and  hematuria, monocular blindness, unilateral weakness, difficulty with  speech or facial droop.  He has dysphagia and odynophagia.  He also  regurgitates food.  He denies melena or hematochezia.  The rest of the  review of systems are negative.   PHYSICAL EXAMINATION:  GENERAL:  He is well-nourished, well-developed,  not in distress.  VITAL SIGNS:  Blood pressure 132/70, pulse 68, respirations 18,  temperature 98.9, oxygen saturation 96% on room air.  HEENT:  Normal.  NECK:  Without  JVD.  LYMPH:  Without lymphadenopathy.  ENDOCRINE:  Without thyromegaly.  CARDIAC:  Normal S1, S2.  Distant heart sounds, regular rate and rhythm,  no murmur.  LUNGS:  Clear to auscultation bilaterally without wheezing, rhonchi,  rales.  ABDOMEN:  Soft, nontender, normoactive bowel sounds, no organomegaly.  EXTREMITIES:  Without clubbing, cyanosis or edema.  MUSCULOSKELETAL:  Without joint deformity.  NEUROLOGIC:  He is alert and oriented x3.  Cranial nerves 2-12 grossly  intact.  VASCULAR:  Without carotid bruits bilaterally.  Femoral artery pulses 2+  bilaterally without bruits.   Chest x-ray reveals COPD without infiltrate.  There is a questionable 8-  mm nodule noted on one-view chest x-ray.  It is suggested that he have a  repeat two-view chest x-ray to further evaluate this.  EKG reveals  normal sinus rhythm, a heart rate of 77, normal axis, no acute changes.   LABORATORY DATA:  Hemoglobin 15.7, platelet count 215,000, sodium 137,  potassium 3.6, BUN 16, creatinine 0.78, glucose 100, TSH is pending,  lipid panel is pending.  Initial amylase and lipase were 301 and 169  respectively.  Followup 160 and 37 respectively.  CK-MB and troponin is  negative x3.   IMPRESSION:  1. Chest pain concerning for unstable angina pectoris.  2. Ex-smoker.  3. Family history of coronary artery disease.  4. Gastroesophageal reflux disease/esophageal stricture.      a.     Workup for dysphagia ongoing.      b.     Follow up with Dr. Cira Servant, plan August 19, 2007 in her       office.  5. Abnormal chest x-ray.   PLAN:  The patient's symptoms are very suggestive ischemic heart  disease.  At this point in time, we plan to transfer him to [facility] for cardiac catheterization.  Risks and benefits of the  procedure have been explained to the patient and his wife, and he agrees  to proceed.  I will continue aspirin and place him on low-dose beta  blocker.  His lipid panel is pending, and  pending the results of his  cardiac catheterization,  a Statin will be initiated if necessary.  Follow up on his chest x-ray needs to be pursued.  A PA and lateral  chest x-ray will be checked at [facility].  Further workup regarding this will  be pending the results of his follow-up chest x-ray.  The patient will  be kept NPO with plans for cardiac catheterization later today at [facility].      Tereso Newcomer, PA-C      Peter C. Eden Emms, MD, [facility]  Electronically Signed    SW/MEDQ  D:  08/05/2007  T:  08/05/2007  Job:  [id number]   cc:   Dr. Rito Ehrlich, [facility]   Kassie Mends, M.D.  [address]  [place] , [place] [address]   Noralyn Pick. Eden Emms, MD, [facility]  [address]  [address]  [place]  [place] [address]

## 2011-01-08 NOTE — Op Note (Signed)
Walter Herring, Walter Herring                ACCOUNT NO.:  [id number]   MEDICAL RECORD NO.:  [id number]          PATIENT TYPE:  AMB   LOCATION:  DSC                          FACILITY:  [facility]   PHYSICIAN:  Cindee Salt, M.D.       DATE OF BIRTH:  01-20-1956   DATE OF PROCEDURE:  03/24/2008  DATE OF DISCHARGE:                               OPERATIVE REPORT   PREOPERATIVE DIAGNOSIS:  Mass, left thumb.   POSTOPERATIVE DIAGNOSIS:  Mass, left thumb.   OPERATION:  Excision of mass, left thumb.   SURGEON:  Cindee Salt, MD.   Threasa HeadsCarolyne Fiscal, RN.   ANESTHESIA:  Upper arm IV regional.   DATE OF OPERATION:  March 24, 2008.   ANESTHESIOLOGIST:  Zenon Mayo, MD   HISTORY:  The patient is a 55 year old male with a history of a large  mass on the ulnar aspect of the proximal phalanx of his left thumb.  It  began after a puncture wound.  It has gradually increased in size  becoming a large fungating lesion over the dorsum of his thumb.  He is  desirous having this removed complaining of pain, discomfort, and  bleeding.  The patient is seen in the preoperative area.  The extremity  marked by both the patient and surgeon.  Antibiotic given.  He is aware  that there is no guarantee with surgery, possibility of infection,  recurrence, injury to arteries, nerves, or tendons, incomplete relief of  symptoms, dystrophy, possibility of recurrence, and possibility of this  being more than a benign lesion, although it looks to be a pyogenic  granuloma.   PROCEDURE:  The patient was brought to the operating room.  A upper arm  IV regional anesthetic carried out without difficulty.  He was prepped  using DuraPrep, supine position, left arm free.  After time-out was  taken, the mass was excised, its base was excised, and this area was  cauterized.  This left a defect approximately 0.5 cm in diameter.  An  ellipse was made obliquely, carried down into the subcutaneous tissue.  This was undermined.  This  allowed it to be closed with interrupted 5-0  Vicryl Rapide sutures.  The metacarpal block was given with 0.25%  Marcaine without epinephrine.  A sterile compressive dressing and splint  were applied.  The specimen was sent to  pathology.  The patient tolerated the procedure well and was taken to  the recovery room for observation in satisfactory condition.  He will be  discharged to home to return to the [facility] [place] in 1 week  on Vicodin.           ______________________________  Cindee Salt, M.D.     GK/MEDQ  D:  03/24/2008  T:  03/25/2008  Job:  [id number]   cc:   [facility]

## 2011-01-08 NOTE — Assessment & Plan Note (Signed)
Walter Herring, Walter Herring                 CHART#:  [id number]   DATE:  01/27/2008                       DOB:  1955/12/08   PROBLEM LIST:  1. Achalasia with Heller myotomy and Dor fundoplication at [facility] in December 2008.  2. Nephrolithiasis.  3. Chronic back pain.   SUBJECTIVE:  Walter Herring is a 55 year old male who presents as a return-  patient visit.  He was last seen by me in February 2009.  He is status  post Heller myotomy, and wanted to make sure that he is going to be able  to maintain his weight and his protein levels.  He presents with  complaining of occasional problem swallowing certain foods such as  hamburger.  He also complains of some heartburn at nighttime and some  regurgitation.  He is only taking Zantac at nighttime for any reflux.  He eats 6 to 7 times a day.  He is nowhere near as bad as before the  surgery.   OBJECTIVE:  Physical Exam:  Weight 176 pounds (stable), height 6 feet 1 inch, temperature 97.9,  blood pressure 120/90, and pulse 88.  GENERAL:  He is in no apparent distress.  Alert and oriented x4.  LUNGS:  Clear to auscultation bilaterally.  CARDIOVASCULAR:  Regular rhythm.  ABDOMEN:  Bowel sounds are present.  Soft, nontender, and nondistended.   ASSESSMENT:  Walter Herring is a 55 year old male who is status post Heller  myotomy, complaining of some regurgitation and heartburn at nighttime.   Thank you for allowing me to see Walter Herring in consultation.  My  recommendations follow.   RECOMMENDATIONS:  1. He is asked to stop the Zantac and add Prilosec 30 minutes before      his last meal.  He already has the head of his bed elevated.  2. Recommended that his meats be ground, chopped, or shredded due to      the fact that he has achalasia and poor dentition.  3. Follow up appointment in 6 months.  4. Consider an EGD in 3 years to screen for esophageal cancer.       Kassie Mends, M.D.  Electronically Signed     SM/MEDQ  D:   01/27/2008  T:  01/28/2008  Job:  [id number]

## 2011-01-08 NOTE — Discharge Summary (Signed)
[name], [name]                ACCOUNT NO.:  [id number]   MEDICAL RECORD NO.:  [id number]          PATIENT TYPE:  INP   LOCATION:  A212                          FACILITY:  [facility]   PHYSICIAN:  Kassie Mends, M.D.      DATE OF BIRTH:  [birth year]   DATE OF ADMISSION:  08/06/2007  DATE OF DISCHARGE:  12/14/2008LH                               DISCHARGE SUMMARY   PRIMARY GASTROENTEROLOGIST:  Kassie Mends, M.D.   PRIMARY CARE PHYSICIAN:  None.   DISCHARGE DIAGNOSES:  1. Possible achalasia.  2. Chronic back pain.  3. Chest pain.  4. History of esophageal dilation at [facility].   HISTORY OF PRESENT ILLNESS/HOSPITAL COURSE:  Walter Herring is a 55 year old  male who was seen as an outpatient initially by gastroenterology on  August 03, 2007.  At that time he complained that his dysphagia was  getting worse.  Over the last 2 months he says it is severe.  He states  when he swallows he gets retrosternal pain.  He also complained of  regurgitation of undigested food within minutes of eating.  He  occasionally was awakened with the symptoms.  He reported loosing 20  pounds in the last few months but his weight has been 168 since 2005.  He had an upper GI series performed on December 5th, in which the  patient swallowed thick and thin barium.  Primary esophageal peristalsis  was absent.  With the patient in the prone right anterior oblique  position ingested barium just sat  in his esophagus without forward  propagation.  The esophagus did empty when he was in the erect position.  There was a suggestion that he had a tight esophageal stricture at the  EG junction.  The remainder of his esophagus was dilated.  He did have  scattered tertiary esophageal contractions.  No mass was identified and  several small polypoid lesions were noted throughout the esophagus.  He  was scheduled for an upper endoscopy on December 9th but presented to  endoscopy with 10 out  of 10 crushing chest pain.  He has a history of  tobacco abuse and a family history of coronary artery disease.   He was admitted and evaluated by cardiology.  He had a cardiac  catheterization.  The cath showed minimal luminal irregularities of the  left anterior descending artery and right coronary artery with no  evidence of obstructive coronary artery disease and normal left  ventricular function.  He was discharged to home and his wife called the  next day and said he was unable to keep down anything including liquids.   He was readmitted on December 11th with the inability to  keep down  solids and liquids.  He had an upper endoscopy performed.  It showed a  tubular esophagus that was atonic.  The LES had an elastic appearance  and feel to it.  It was easily traversed with a scope.  He had a few  tiny antral erosions.  A Maloney dilator was passed beginning with  a 80-  [place] dilator and ending with a 60-Frenchy dilator.  He had a  superficial tear through the LES.  In spite of dilation, he continued to  be unable to tolerate even a full liquid diet.   On December 13th, he had a central line placed by surgery.  He was  written to have TPN started on today.  Today, he continues to complain  of not being able to swallow anything.  His chest pain is not an issue.   DISCHARGE MEDICATIONS:  1. Protonix 40 mg IV daily.  2. Morphine 2 mg every 6 hours as needed for pain.  3. Zofran 4 mg IV every 4 hours as needed, one dose on August 08, 2007.  4. Phenergan 25 mg IV q.4 h. as needed, no Phenergan used since      admission.   DISCHARGE LABORATORY:  December 12th, showed a white count of 4.9,  hemoglobin 14.2, platelets 189.  December 14th, showed a sodium 138,  potassium 3.3, chloride 106, CO2 25, glucose 61, BUN 6, creatinine 0.66.  Total bili 1.1, alk phos 65, AST 20, ALT 15, total protein 6.3, albumin  3, phosphorous 2.6, magnesium 1.9.  Prior to his last hospital   admission, he had 3 sets of cardiac enzymes which were normal.   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  The colon appeared unremarkable.      Kassie Mends, M.D.  Electronically Signed     SM/MEDQ  D:  08/09/2007  T:  08/09/2007  Job:  [id number]

## 2011-01-08 NOTE — Discharge Summary (Signed)
NAMEHILL, MACKIE NO.:  [id number]   MEDICAL RECORD NO.:  [id number]          PATIENT TYPE:  OBV   LOCATION:  A208                          FACILITY:  [facility]   PHYSICIAN:  Dorris Singh, DO    DATE OF BIRTH:  1955/11/03   DATE OF ADMISSION:  08/04/2007  DATE OF DISCHARGE:  12/10/2008LH                               DISCHARGE SUMMARY   ADMISSION DIAGNOSES:  1. Chest pain of unclear etiology.  2. Significant history of dysphagia with weight loss, under      evaluation.  3. Chronic back pain.   DISCHARGE DIAGNOSES:  1. Ischemic heart disease.  2. Tobacco abuse.  3. Gastroesophageal reflux with esophageal stricture.  4. Chronic obstructive pulmonary disease.   The patient does not have primary care.   CONSULTATIONS:  Cardiology.   TESTING DONE:  1. On December 10 he had a two-view chest, which demonstrated changes      of COPD and bronchitis and no acute abnormalities.  No evidence of      pulmonary nodules.  2. On December 9 he had a one view which showed COPD without definite      acute infiltrate, questionable 8-mm nodule versus summation      artifact of right lung base, follow-up PA and lateral chest      recommended.   His H&P was done by Dr. Osvaldo Shipper.  Please refer to that but to  summarize, the patient is a 55 year old Caucasian male with longstanding  esophageal motility issues, who presents now with chest pain.  The chest  pain is absolutely related to his gastrointestinal symptoms.  Although  he does have some risk factors for a cardiac etiology, his symptoms are  not quite typical for coronary artery disease, but, again, do warrant  further workup at this time.   His plan:  He was admitted and [facility] was consulted and  stressed him on the next day.  He was started on aspirin and GI and DVT  prophylaxis.  Also Dr. Cira Servant was consulted to see him, and he is  scheduled to see her on the 24th as an outpatient.  He was then  seen by  [facility] on December 10, who based on his current history,  they felt that his symptoms were suggestive of ischemic heart disease.  The went ahead and will transfer him to [facility] for a cardiac  catheterization.  Risks and benefits were also mentioned to the wife.  I  saw the patient and explained this to him as well.  The patient was  quite nervous for the procedure.  We will go ahead order him some Ativan  for the trip for the transfer to [facility], answered any questions that  he and is wife may have had at that time.   The medications that he was sent on to [facility] will be:  1. Xanax 0.25 mg x1 now and then every 6 hours for anxiousness.  2. Aspirin 81 mg p.o. daily.  3. Lovenox 40 mg subcu daily.  4. Protonix 40 mg p.o. daily.  He has an IV.  He is going with a 3 mL      IV q.12h.  5. Robaxin 500 mg p.o. q.6h.  6. Oxycodone 5 mg p.o. q.4h.   His condition is stable, and he will be transferred to [facility] for  cardiac catheterization.      Dorris Singh, DO  Electronically Signed     CB/MEDQ  D:  08/05/2007  T:  08/05/2007  Job:  [id number]

## 2011-01-08 NOTE — Assessment & Plan Note (Signed)
NAMEMarland Herring  [name], [name]                 CHART#:  [id number]   DATE:  06/20/2008                       DOB:  07/18/1956   PRIMARY CARE PHYSICIAN:  Melvyn Novas, MD.   REASON FOR CONSULTATION:  Postprandial abdominal pain.   PROBLEM LIST:  1. History of achalasia, status post Heller myotomy and/or      fundoplication at [facility] in December 2008.  2. Nephrolithiasis.  3. Chronic back pain.  4. Screening colonoscopy on 12/03/2007 by Dr. Cira Servant, which was normal.  5. Gastroesophageal reflux disease.   SUBJECTIVE:  The patient is a 55 year old Caucasian male.  He tells me  he has been having epigastric pain that is brought on after eating a  couple of bites.  This has been persistent for a couple of months.  He  has history of chronic indigestion, has been on b.i.d. omeprazole and  was doing previously well with this status post surgery for achalasia.  He describes the epigastric pain as an aching and it is not present if  he does not eat.  He rates the pain 7/10 at worse on a pain scale.  He  denies any fever or chills.  His weight has steadily increased.  He does  have nausea associated with the pain.  He has actually vomited a couple  of times as well.  He has Phenergan for which he takes on a p.r.n. basis  at home.  He notices bloating after eating as well.  He is having soft  brown daily bowel movements without any rectal bleeding or melena.  He  has recently had oral surgery.  He denies any alcohol or drug use.  He  has been taking naproxen 500 mg b.i.d., but the pain was persistent  prior to him starting naproxen.   CURRENT MEDICATIONS:  Promethazine 25 mg p.r.n., Flexeril 10 mg p.r.n.,  Tylenol ES p.r.n., Benefiber p.r.n., naproxen 500 mg b.i.d., omeprazole  20 mg b.i.d., hydrocodone 5/500 mg t.i.d., Lipitor 20 mg daily, and  paroxetine 20 mg daily.   ALLERGIES:  No known drug allergies.   OBJECTIVE:  VITAL SIGNS:  Weight 185 pounds, height 73 inches,   temperature 97.7, blood pressure 140/90, and pulse 72.GENERAL:  He is a  well-developed and well-nourished 55 year old gentleman who is alert,  oriented, pleasant, and cooperative in no acute distress.HEENT:  Sclerae  clear and nonicteric.  Conjunctivae pink.  Oropharynx pink and moist  without any lesions.NECK:  Supple without mass or thyromegaly.CHEST:  Heart regular rate and rhythm.  Normal S1 and S2.ABDOMEN:  Positive  bowel sounds x4.  No bruits auscultated.  Soft and nondistended.  He has  mild epigastric tenderness on deep palpation.  No rebound, tenderness,  or guarding.  No hepatosplenomegaly or mass.EXTREMITIES:  Without edema.   LABORATORY STUDIES:  From 05/05/2008, he had a hemoglobin 12.5,  hematocrit 38.5, EOS count was 8%.  He had platelet count of 220.  He  had a normal comprehensive metabolic panel including LFTs and a normal  TSH..   ASSESSMENT:  The patient is a 55 year old Caucasian male with history of  postprandial abdominal pain with nausea and vomiting.  I suspect, it  could be biliary etiology and we do need to rule out cholelithiasis as  well as biliary dyskinesia given his symptoms of peptic ulcer  disease  remains in the differential as well. He is status post Heller myotomy  and/or fundoplication for history of achalasia in December 2008.   PLAN:  1. Abdominal ultrasound.  2. If this is negative, we would proceed with HIDA scan.  3. We will hemoccult stools to rule out GI bleed.       Lorenza Burton, N.P.  Electronically Signed     Kassie Mends, M.D.  Electronically Signed    KJ/MEDQ  D:  06/20/2008  T:  06/20/2008  Job:  [id number]   cc:   Melvyn Novas, MD

## 2011-01-08 NOTE — Assessment & Plan Note (Signed)
NAMEMarland Kitchen  Walter Herring                 CHART#:  [id number]   DATE:  08/25/2008                       DOB:  Sep 02, 1955   PRIMARY CARE PHYSICIAN:  Melvyn Novas, MD   PROBLEM LIST:  1. History of achalasia with Heller myotomy and Dor fundoplication at      [facility] in December 2008.  2. Nephrolithiasis.  3. Chronic back pain.  4. Chronic nausea.  5. Chronic upper abdominal pain.  6. Mild normocytic anemia with hemoglobin of 12.5 on 05/05/2008.  7. Screening colonoscopy on 12/03/2007 by Dr. Cira Servant, which is normal.  8. Gastroesophageal reflux disease.   SUBJECTIVE:  The patient is a 55 year old Caucasian male.  He is 1-year  status post Heller myotomy and Dor fundoplication at [facility].  He complains of upper abdominal pain  postprandially usually occurs within the few bites of eating and can  last several hours.  He complains of anorexia with the pain.  He rates  the pain 7/10 on pain scale, it does not radiate to his back.  He rarely  has vomiting, but generally stays nauseated.  He has been treated for  anxiety and was started on Paxil.  He does not notice that this made a  difference.  He did discontinue his Naprosyn and this made no  difference, so he has resumed it.  He had a mild anemia and was found to  have ferritin of 25 and iron of 137.  He had an abdominal ultrasound  looking for gallstones, which is negative on 06/21/2008.  He denies any  fevers or chills.  His weight is up 3 pounds since he was last seen 2  months ago.   CURRENT MEDICATIONS:  Promethazine 6.25 mg q.4-6 hours p.r.n., Benefiber  daily, naproxen 500 mg b.i.d., omeprazole 20 mg b.i.d., hydrocodone  7.5/500 mg t.i.d., Lipitor 20 mg daily, paroxetine 20 mg daily,  multivitamin daily, and Ensure daily.   ALLERGIES:  No known drug allergies.   OBJECTIVE:  VITAL SIGNS:  Weight 188 pounds, height 73 inches,  temperature 97.6, blood pressure  112/80, and pulse 72.  GENERAL:  The patient is a well-developed and well-nourished Caucasian  male.  He does appear quite anxious.  His wife is at his side.  He does  have some tremors, which he tells me he gets when he gets nervous.  HEENT:  Sclerae clear, nonicteric.  Conjunctivae pink.  Oropharynx pink  and moist without any lesions.  CHEST:  Heart regular rate and rhythm.  Normal S1 and S2.  ABDOMEN:  Positive bowel sounds x4.  No bruits auscultated.  Soft,  nontender, nondistended without palpable mass or hepatosplenomegaly.  No  rebound, tenderness, or guarding.  EXTREMITIES:  Without clubbing or edema.   ASSESSMENT:  The patient is a 55 year old Caucasian male with history of  achalasia, status post Heller myotomy and Dor fundoplication.  He has  had chronic upper abdominal pain and chronic nausea.  His pain is worse  postprandially.  He has had a history of mild anemia.  I suspect he has  a lot of free-floating anxiety, which could be contributing to his  symptoms.  We should look for biliary dyskinesia given the significant  component of postprandial abdominal pain.   PLAN:  1. We will  recheck his ferritin, CBC, LFTs, and MET-7.  2. Phenergan 25 mg one half to one q.8 h. p.r.n., HIDA scan.  3. He is to talk with Dr. [name] regarding his continued anxiety.       Walter Herring, N.P.  Electronically Signed     [name], M.D.  Electronically Signed    KJ/MEDQ  D:  08/25/2008  T:  08/25/2008  Job:  [id number]   cc:   Melvyn Novas, MD

## 2011-01-08 NOTE — Group Therapy Note (Signed)
Walter Herring, Walter Herring NO.:  [id number]   MEDICAL RECORD NO.:  [id number]          PATIENT TYPE:  INP   LOCATION:  A212                          FACILITY:  [facility]   PHYSICIAN:  Dorris Singh, DO    DATE OF BIRTH:  15-Aug-1956   DATE OF PROCEDURE:  DATE OF DISCHARGE:                                 PROGRESS NOTE   SUBJECTIVE:  The patient is seen today, resting comfortably in bed.  He  had a central line placed in with no complications.  We will go ahead  and start TPN today and await possible transfer to [facility].  He has no  complaints today, just hungry, so hopefully he will start feeling a  little bit better once we get some nutrition.  We will also get him up  in the chair.  He is complaining of some leg pain, stating that he is  not used to sitting like this as he has for the last couple of days.  So, we will continue to monitor him.   OBJECTIVE:  VITAL SIGNS:  His temperature is 97.6, pulse is 75,  respirations 18, blood pressure 107/66.  GENERAL:  This is a 55 year old  male who is sitting in bed in no acute distress.  HEART:  Regular rate  and rhythm.  LUNGS:  Clear to auscultation bilaterally.  ABDOMEN:  Flat,  nontender and non-distended.  EXTREMITIES:  Positive pulses.  No edema  or cyanosis.   His labs done for today include BMET; his potassium is slow at 3.3 and  his glucose is low.   ASSESSMENT:  1. Achalasia.  2. Poor nutritional intake.  3. Hypokalemia.   PLAN:  We will go ahead and await further recommendations for GI  regarding his placement and we will also replace his potassium and for  his poor nutritional intake, we will start him on TPN.      Dorris Singh, DO  Electronically Signed     CB/MEDQ  D:  08/09/2007  T:  08/09/2007  Job:  [id number]

## 2011-01-08 NOTE — Op Note (Signed)
NAME:  Walter Herring, CHAIN NO.:  [id number]   MEDICAL RECORD NO.:  [id number]          PATIENT TYPE:  AMB   LOCATION:  DAY                           FACILITY:  [facility]   PHYSICIAN:  Kassie Mends, M.D.      DATE OF BIRTH:  October 20, 1955   DATE OF PROCEDURE:  12/03/2007  DATE OF DISCHARGE:                               OPERATIVE REPORT   PRIMARY CARE [place]:  [facility] Department.   PROCEDURE:  Colonoscopy   INDICATION FOR EXAM:  Walter Herring is a 55 year old male who presents for  an average risk colon cancer screening.   FINDINGS:  1. Normal colon without evidence of polyps, masses, inflammatory      changes, diverticula, or AVMs.  2. Normal retroflexed view of the rectum.   RECOMMENDATIONS:  1. Walter Herring can add Benefiber or Fibersure twice daily to his diet.      I did not recommend a high-fiber diet due to his history of      achalasia.  2. Screening colonoscopy in 10 years.   MEDICATIONS:  1. Demerol 100 mg IV.  2. Versed 6 mg IV.  3. Phenergan 25 mg IV.   PROCEDURE TECHNIQUE:  Physical exam was performed, informed consent was  obtained from the patient.  I have explained the benefits, risks, and  alternatives of the procedure.  The patient was connected to the monitor  and placed in the left lateral position.  Continuous oxygen was provided  by nasal cannula and IV medicines were administered through an  indwelling cannula.  After administration of sedation and rectal exam,  the patient's rectum was intubated and scope was advanced under direct  visualization through the cecum.  The scope was removed slowly by  carefully examining the color, texture, anatomy, and integrity of the  mucosa on the way out.  The patient was recovered in endoscopy, and  discharged home in satisfactory condition.      Kassie Mends, M.D.  Electronically Signed    SM/MEDQ  D:  12/03/2007  T:  12/04/2007  Job:  [id number]

## 2011-01-08 NOTE — Cardiovascular Report (Signed)
NAMEBRITT, Walter Herring                ACCOUNT NO.:  [id number]   MEDICAL RECORD NO.:  [id number]          PATIENT TYPE:  INP   LOCATION:  2899                         FACILITY:  [facility]   PHYSICIAN:  Everardo Beals. Juanda Chance, MD, FACCDATE OF BIRTH:  Jun 13, 1956   DATE OF PROCEDURE:  08/05/2007  DATE OF DISCHARGE:                            CARDIAC CATHETERIZATION   CLINICAL HISTORY:  Mr. Rottenberg is 55 years old.  He has no prior history  of known heart disease.  He does have a history of an esophageal  stricture and has had recent dysphagia.  He was admitted to [facility] with chest and jaw pain and was seen by Tereso Newcomer and Charlton Haws and transferred to [facility] for further evaluation and  angiography.   PROCEDURE:  The procedure was performed via the right femoral arteries,  an arterial sheath and 5 [place] __________  coronary catheters.  A  __________ and Omnipaque contrast was used.  The patient tolerated the  procedure well and left the laboratory in satisfactory condition.   RESULTS:  The aortic pressure was 119/72 with a mean of 92 and left  __________  pressure is 119/6.   The left main coronary artery was free of significant disease.  The left  anterior descending artery gave rise to two diagonal branches in the  septal perforator.  The LAD was irregular, but there was no significant  obstruction.   The circumflex gave rise to a large marginal branch with a small branch  and three posterolateral branches.  These vessels were free of  significant disease.  The right __________  gave rise to a __________  branches, two right ventricular branches, and a posterior descending  branch.  There were irregularities in the mid and distal right coronary  artery with no significant obstruction.   The left ventriculogram performed on the RAO projection showed good wall  motion with no areas of hypokinesis.  The estimated ejection fraction  was 60%.   CONCLUSION:   Minimal luminal irregularities of the left anterior  descending artery and right coronary artery with no evidence of  obstructive coronary artery disease and normal left ventricular  function.   RECOMMENDATIONS:  Reassurance.  In view of these findings, I think the  patient's symptoms were not likely cardiac.  Will plan discharge later  today.  The patient already has a follow-up with Dr. Loreta Ave for further GI  evaluation in the next couple of weeks.      Bruce Elvera Lennox Juanda Chance, MD, [facility]  Electronically Signed     BRB/MEDQ  D:  08/05/2007  T:  08/05/2007  Job:  [id number]   cc:   Noralyn Pick. Eden Emms, MD, [facility]  Angus G. Renard Matter, MD  Anselmo Rod, M.D.

## 2011-01-08 NOTE — Discharge Summary (Signed)
[name], [name]                ACCOUNT NO.:  [id number]   MEDICAL RECORD NO.:  [id number]          PATIENT TYPE:  INP   LOCATION:  2899                         FACILITY:  [facility]   PHYSICIAN:  Everardo Beals. Juanda Chance, MD, FACCDATE OF BIRTH:  01-06-1956   DATE OF ADMISSION:  08/05/2007  DATE OF DISCHARGE:  08/05/2007                         DISCHARGE SUMMARY - REFERRING   PRIMARY CARDIOLOGIST:  Theron Arista C. Eden Emms, MD, [facility].   PRIMARY CARE PHYSICIAN:  Angus G. McInnis MD.   BRIEF HISTORY:  [name] is a 55 year old white male who presented with  severe chest discomfort over the preceding two months that has been  gradually worsening.  He points to the epigastric area, this his chest  and right jaw and both arms and described as a pressure occurring at  rest and with exertion, occasionally waking him up at night.  Associated  shortness of breath, nausea, diaphoresis.  He has done very little  activity because of the chest discomfort.  He has had near syncope with  bending over, but no actual syncope.   PAST MEDICAL HISTORY:  Notable for  1. GERD.  2. Esophageal stricture and esophageal dilatation in the past.  3. Degenerative disk disease.  4. Remote tobacco   LABORATORY DATA:  At [facility], chest x-ray showed COPD  without infiltrate, possible 8 mm nodule, needs a two-view chest x-ray.   EKG did not show any changes.   CK, MBs and troponins were unremarkable x3.  Amylase and lipase were  initially elevated at 301 and 169 and decreased to 168 and 37.  H&H was  15.7 and 46.8, platelets 215 wbc 6.9.  Sodium 137, potassium 3.6, BUN  16, creatinine 0.78, glucose 100.  TSH was pending.  The patient was  evaluated by Tereso Newcomer and Dr. Eden Emms at [facility].  Given  his chest discomfort and risk factors, it was felt that he should  undergo cardiac catheterization.  Thus, he was transferred to [facility] for further evaluation.  Catheterization was  performed by Dr. Juanda Chance on August 05, 2007, and showed some  irregularities in the LAD and RCA system, EF of 60%.  It was felt that  his discomfort was not cardiac related and that he could be discharged  home post bed rest if he was ambulating without difficulty, with follow-  up with Dr. Loreta Ave, his GI physician in [place].  Post  bedrest, the patient was ambulating without difficulty.  He was  discharged home.   DISCHARGE DIAGNOSES:  1. Noncardiac chest discomfort.  2. Nonobstructive coronary artery disease with normal left ventricular      function by cardiac catheterization.  3. Elevated amylase and lipase.  4. Remote tobacco use.   PROCEDURE PERFORMED:  Cardiac catheterization on August 05, 2007, by  Dr. Charlies Constable.   DISPOSITION:  The patient is discharged home, asked to maintain a heart  healthy, low ascitic diet.  Wound care and activities are per  supplemental discharge sheet.  He was asked to continue his home  medications, believed to include Ultracet,  Robaxin, ibuprofen.  I have  given him a one-month supply of Protonix 40 mg daily.  He is asked to  obtain a primary care physician for follow-up or arranged follow-up with  Dr. Renard Matter whom he has seen previously.  He was asked to bring all  medications to  all appointments.  He will have a groin check with Dr.  Eden Emms on Thursday, August 13, 2007, at 11:30 and follow up with Dr.  Loreta Ave as instructed.   DISCHARGE TIME:  20 minutes.      Joellyn Rued, PA-C      Bruce R. Juanda Chance, MD, [facility]  Electronically Signed    EW/MEDQ  D:  08/05/2007  T:  08/05/2007  Job:  [id number]   cc:   Noralyn Pick. Eden Emms, MD, [facility]  Angus G. Renard Matter, MD

## 2011-01-08 NOTE — H&P (Signed)
[name], [name] NO.:  [id number]   MEDICAL RECORD NO.:  [id number]          PATIENT TYPE:  OBV   LOCATION:  A208                          FACILITY:  [facility]   PHYSICIAN:  Osvaldo Shipper, MD     DATE OF BIRTH:  03-12-1956   DATE OF ADMISSION:  08/04/2007  DATE OF DISCHARGE:  LH                              HISTORY & PHYSICAL   PRIMARY CARE PHYSICIAN:  The patient is not able to tell me if he has a  primary care physician or not.  It says somewhere on his record that his  primary care physician is Dr. Selinda Flavin.  He is also followed by Dr.  Kassie Mends for dysphagia.   ADMISSION DIAGNOSES:  1. Chest pain, of unclear etiology.  2. Significant history of dysphagia with weight loss, under      evaluation.  3. Chronic back pain.   CHIEF COMPLAINT:  Chest pain on and off for two or three months.   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  He  used to be seen at [facility] until about 10 years ago, when he had his  last esophageal dilatation apparently.  He was evaluated by Dr. Cira Servant  recently for this condition and the plan was to do an EGD today;  however, the patient started complaining of chest pain and subsequently  he was sent to the emergency department for further evaluation.  The  pain is located in the retrosternal area which is described as a  crushing-type sensation which radiates towards the neck and to the  bilateral arms.  The pain has been ongoing for about two months now on  and off.  He says it might be longer, but it has been worse for the past  month or two months.  The pain could happen at any time.  He had not  been doing any activity when this happens.  Sometimes it is associated  with food, but not all the time.  It is associated also with  diaphoresis, mild shortness of  breath and palpitations.  The pain is  10/10 in intensity.  He does have a history of regurgitation which has  been ongoing.  He reports a history of weight loss of 20 pounds in two  months.  No history of diarrhea.  No history of any abdominal pain as  such.   CURRENT MEDICATIONS AT HOME:  1. He takes Ultracet as needed.  2. Methocarbamol 500 mg, one to two tab q.6h. as needed.   ALLERGIES:  No known drug allergies.   PAST MEDICAL HISTORY:  1. Positive for esophageal dilatation more than 10 years ago for      esophageal disease.  2. He has a longstanding history of dysphagia.  3. 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  4. History of chronic back pain from a work-related injury.  5. Pain in his right knee, as part of the  same problem.   SOCIAL HISTORY:  He lives at present with his wife.  Used to work in the  [employer].  Quit smoking 10 years ago.  Has about a 30-pack-year  history of smoking.  No alcohol use.  No illicit drug use.   FAMILY HISTORY:  Father had seizure disorder, unknown heart disease.  History also of chronic obstructive pulmonary disease and lung cancer in  the father and mother also had epilepsy and a history of heart disease,  of unknown type.  His brother had a massive heart attack at the age of  the 56's.  Three sisters, one of them has seizure disorder.   REVIEW OF SYSTEMS:  GENERAL:  Positive for weakness, malaise.  HEENT:  Unremarkable.  CARDIOVASCULAR:  As in the HPI.  RESPIRATORY:  As in the  HPI.  GI:  As in the HPI.  GENITOURINARY:  Unremarkable.  MUSCULOSKELETAL:  Positive for back pain and knee pain.  NEUROLOGIC:  Unremarkable.  PSYCHIATRIC:  Unremarkable.  DERMATOLOGIC:  Unremarkable.  Other systems are unremarkable.   PHYSICAL EXAMINATION:  VITAL SIGNS:  When he presented to the emergency  department he was found to be afebrile.  Blood pressure 117/76, heart  rate 86, respirations 14, saturation 97% on room air.  GENERAL:  A thin, well-developed  individual, in no distress, slightly  anxious.  HEENT:  No pallor, no icterus.  Mucous membranes moist.  No oral lesions  are noted.  NECK:  Soft, supple.  No thyromegaly is appreciated.  No jugular venous  distention, no bruits heard.  LUNGS:  Clear to auscultation bilaterally.  CARDIOVASCULAR:  S1 and S2 normal, regular.  No murmurs appreciated.  No  S3 or S4.  ABDOMEN:  Soft, some nonspecific tenderness in the middle part of the  abdomen but no rebound or guarding.  Bowel sounds are present.  No mass  or organomegaly is appreciated.  EXTREMITIES:  No edema.  Peripheral pulses are palpable.   LABORATORY DATA:  His CBC is unremarkable.  His CMET is unremarkable.  His cardiac markers are negative so far.   He had a chest x-ray which showed an 8 mm nodule, versus summation  artifact in the right lung base.  Follow-up upright PA and lateral chest  x-rays were recommended.  It also showed evidence for chronic  obstructive pulmonary disease without any acute process.   Other imaging studies that were reviewed include the upper GI series  which was done on July 31, 2007, which showed evidence of a high-  grade stricture in the distal esophagus at the EG junction, absent  primary esophageal peristalsis, numerous tertiary esophageal  contractions noted, severe dysmotility was the finding that was thought  to be there.  No other masses were noted.  The stomach and duodenum were  normal.   Actually the electrocardiogram that was done here shows sinus rhythm  with possible left axis deviation.  I do not appreciate any Q-waves.  The intervals appear to be in the normal range.  T-waves are slightly  hyper-acute in lead II, lead III, lead IV prominent for other.  I do not  appreciate any significant ST changes.  There could be some early  repolarization changes that are present.  Nonspecific T-wave changes are  also present.  No other electrocardiograms are available to compare.    ASSESSMENT:  This is a 55 year old Caucasian male with longstanding  esophageal motility issues, who presents with now chest pain.  The chest  pain  is possibly related to his gastrointestinal symptoms, though he  does have some risk factors for cardiac etiology.  His symptoms are not  quite typical for coronary artery disease, but again do warrant further  evaluation at this time.  Other etiologies could be pancreatitis, which  is less likely, musculoskeletal etiology less likely, gastroesophageal  reflux disease also less likely.   PLAN:  Chest pain:  I think we will go ahead and consult [facility] to see if they want to stress him tomorrow.  He told me that  he will not be able to walk on the treadmill because of his back pain.  I will put him on a baby aspirin on a daily basis.  We will at this time  hold off on beta blockers and any other acid treatment.  Deep venous  thrombosis prophylaxis will be provided.  Protonix will be given.  His  weight loss is concerning, which is probably because of his dysphagia  and difficulty to ingest food, as the most common reason.  Of course we  always worry about occult malignancies but Dr. Cira Servant, I am sure,  will  follow up on this.  His albumin is 3.7.  We will go ahead and check a  TSH.  We will continue his home medications.  We will check a chest x-  ray PA and lateral to evaluate the abnormalities seen on the portable  film.  Electrocardiograms will be repeated.   Further management decisions, depending upon the specialty input,  laboratory results, etc.      Osvaldo Shipper, MD  Electronically Signed     GK/MEDQ  D:  08/04/2007  T:  08/04/2007  Job:  [id number]   cc:   Gerrit Friends. Dietrich Pates, MD, [facility]  [address]  [place], [place] [address]   Kassie Mends, M.D.  [address]  [place] , [place] [address]

## 2011-01-08 NOTE — Consult Note (Signed)
Walter Herring, Walter Herring                ACCOUNT NO.:  [id number]   MEDICAL RECORD NO.:  [id number]          PATIENT TYPE:  OBV   LOCATION:  A208                          FACILITY:  [facility]   PHYSICIAN:  Kassie Mends, M.D.      DATE OF BIRTH:  [birth year]   DATE OF CONSULTATION:  DATE OF DISCHARGE:                                 CONSULTATION   REQUESTING PHYSICIAN:  Dr. Dimas Aguas.   REASON FOR CONSULTATION:  Dysphagia, chest pain, regurgitation/abnormal  upper GI series.   HISTORY OF PRESENT ILLNESS:  Walter Herring is a 55 year old Caucasian male.  He tells me he has had a long-standing history of chronic dysphagia.  He  tells me over the last 2 months it has become quite severe.  He feels as  though when he tries to swallow he has severe retrosternal chest pain.  He also complains of regurgitation of undigested food within minutes of  eating.  He has problems with liquids as well.  He describes the chest  pain as a pressure.  He is occasionally awakened from sleep with these  symptoms.  He tells me that his symptoms are quite severe.  After eating  he does occasionally have some upper abdominal pain which at times has  doubled him over.  He has some nausea but denies any vomiting.  He has  lost a total of 20 pounds in the last 2 months.  He tells me he stays  hungry.  He has a bowel movement every other day, denies any rectal  bleeding or melena.   Upper GI series shows high grade stricture in the distal esophagus at  the esophagogastric junction, absent primary esophageal parastolsis,  numerous tertiary esophageal contractions consistent with severe  dysmotility (almost an achalasia picture), small polypoid lesions  throughout the esophagus likely inflammatory, and an otherwise normal-  appearing stomach and duodenum.   PAST MEDICAL/SURGICAL HISTORY:  1. Chronic back pain related to a work injury.  2. Esophageal dilatation about 10 years ago at [facility].   CURRENT MEDICATIONS:  1.  Ultracet 1-2 q.6 h. p.r.n.  2. Methocarbamol 500 mg, one to two q.6 h. p.r.n.   ALLERGIES:  No known drug allergies.   FAMILY HISTORY:  There is no known family history of colorectal  carcinoma, liver or chronic GI problems.   SOCIAL HISTORY:  Walter Herring is married.  He has one healthy daughter.  He  is attempting to get disability due to a work-related back injury.  He  has a 9-year history of chewing tobacco.  He smoked for about 9 years as  well.  He denies any alcohol or drug use.   REVIEW OF SYSTEMS:  See HPI.  Otherwise, negative.   PHYSICAL EXAMINATION:  VITAL SIGNS:  Weight 168 pounds, height 73  inches, temp 98.1, blood pressure 110/84, and pulse 88.  GENERAL:  Walter Herring is a 55 year old Caucasian male who is alert,  oriented, pleasant, cooperative and appears older than his stated age.  HEENT:  Sclerae clear, nonicteric.  Conjunctivae pink.  Oropharynx pink  and moist without  any lesions.  NECK:  Supple.  He does have bilateral anterior cervical node fullness  without discrete mass or tenderness.  CHEST:  Heart rate regular rate and rhythm.  Normal S1 S2 without  murmurs, clicks, rubs, or gallops.  LUNGS:  Clear to auscultation bilaterally.  ABDOMEN:  Positive bowel sounds x4.  No bruits auscultated.  Soft,  nontender, nondistended without palpable masses or hepatosplenomegaly.  No rebound tenderness or guarding.  EXTREMITIES:  Without clubbing or edema bilaterally.  SKIN:  Pink, warm, and dry without rash or jaundice.   IMPRESSION:  Walter Herring is a 55 year old Caucasian male with a 74-month  history of chest pain, regurgitation, and significant weight loss.  Upper GI series concerning for high grade stricture in the distal  esophagus at the esophagogastric junction along with severe dysmotility  findings suggestive of an achalasia picture.  He is going to require  further evaluation with EGD and possible esophageal dilatation for high  grade stricture in the distal  esophagus and to rule out malignancy  versus achalasia.   PLAN:  1. EGD with possible esophageal dilatation with Dr. Cira Servant in the near      future.  I have discussed this procedure including the risks and      benefits which include, but are not limited to, bleeding,      infection, perforation, drug reaction.  He agrees with the plan.      Consent is to be obtained.  2. Begin Nexium 40 mg daily.  I have given him 2 weeks' worth of      samples.  3. He will need screening colonoscopy at another date, when he will be      able to keep down prep.   We would like to thank Dr. Dimas Aguas for allowing [place] to participate in the  care of Walter Herring.   ADDENDUM:  Patient presented to endoscopy c/o 10/10 crushing chest pain,  which has gotten worse over the last month. The pain awakens him in the  middle of the night. It is occasionally associated with diaphoresis. It  radiates to his neck and right chest as well as his arms. This degree of  pain in unexpected in a patient with presumed achalasia. His age, family  history, and remote tobacco use warrant further cardiac evaluation prior  to EGD. May eventually need Botox or Heller myotomy if diagnosis of  Achalasia confirmed. Patient sent from endoscopy to the ED. Discussed  concerns with ED physician.      Lorenza Burton, N.P.      Kassie Mends, M.D.  Electronically Signed    KJ/MEDQ  D:  08/03/2007  T:  08/03/2007  Job:  [id number]

## 2011-01-08 NOTE — Assessment & Plan Note (Signed)
NAMEMarland Kitchen  DEROY, NOAH                 CHART#:  [id number]   DATE:  11/26/2007                       DOB:  06-24-1956   CHIEF COMPLAINT:  Nausea, dizziness.   SUBJECTIVE:  The patient is a 55 year old male who is status post Heller  myotomy and Dor fundoplication at [facility] with a given history of achalasia.  He presents today feeling  sick.  He has a history of chronic back pain.  He is being referred to  the pain clinic by the [facility].  He tells me  he feels swimmy headed.  On multiple occasions he has had to catch  himself from falling.  He has nausea all day long.  He has mid  abdominal pain as well.  His pain is worse after eating.  His pain has  been worse for the last month.  His surgery was in December 2008.   He is taking Ultracet b.i.d. and t.i.d. as well as methocarbamol b.i.d.  and Phenergan nightly   His weight is up 5 pounds in the past month.  He complains of a hot  diuretic feeling.  He can only eat small amounts.  He denies any fever  or chills.  He denies any rectal bleeding or melena.  He denies any  diarrhea, constipation.   He did have LFTs  drawn on 11/19/2007, which were normal.  His albumin  was normal as well.  His wife is concerned that he is not getting enough  to eat due to financial situation right now.   CURRENT MEDICATIONS:  See the list from 11/26/2007.   ALLERGIES:  No known drug allergies.   OBJECTIVE:  VITAL SIGNS:  Weight 175 pounds, height 73 inches, temp  98.2, blood pressure 120/88, pulse 76.  GENERAL:  The patient is a well-developed, well-nourished, Caucasian  male in no acute distress.  HEENT:  Sclerae clear, nonicteric.  Conjunctivae pink.  Oropharynx pink  and moist without any lesions.  CHEST:  Heart regular rate and rhythm with a normal S1 S2 without  murmurs, clicks, rubs, or gallops.  ABDOMEN:  Positive bowel sounds x4.  No bruits auscultated.  Soft,  mildly tender  in the epigastrium and right upper quadrant and around the  umbilicus.  There is no rebound tenderness or guarding.  No  hepatosplenomegaly or mass.  EXTREMITIES:  Without clubbing or edema bilaterally.   ASSESSMENT:  The patient is a 55 year old male with chronic nausea as  well as some mid abdominal pain.  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  His main  concern today is dizziness and near syncope.  He is on a significant  amount of pain medication as well as muscle relaxant and Phenergan which  could be contributing to his symptoms.  He has chronic back pain and  this needs to be further evaluated as well.  Differentials include  peptic ulcer disease, gallbladder disease, or as a result of his  medications, or central etiology.  He is going to need further  evaluation in an urgent manner and I have discussed referral to the  emergency room with him today and he agrees with this plan.   PLAN:  1. STAT CBC, MET-7, and UA.  2. He is  going to go to the emergency room for further evaluation of      his near syncope and dizziness.  3. Hemoccult stools x3.  4. He is to follow up with Dr. Carolynn Sayers at [facility] as scheduled this month.  5. He is going to need a screening colonoscopy once we sort out his      abdominal pain and nausea as right now he would have a difficult      time keeping down the prep.       Lorenza Burton, N.P.  Electronically Signed     Kassie Mends, M.D.  Electronically Signed    KJ/MEDQ  D:  11/27/2007  T:  11/27/2007  Job:  [id number]   cc:   [facility]

## 2011-01-08 NOTE — Group Therapy Note (Signed)
NAMEJACIEL, Walter Herring                ACCOUNT NO.:  [id number]   MEDICAL RECORD NO.:  [id number]          PATIENT TYPE:  INP   LOCATION:  A212                          FACILITY:  [facility]   PHYSICIAN:  Dorris Singh, DO    DATE OF BIRTH:  Jun 02, 1956   DATE OF PROCEDURE:  DATE OF DISCHARGE:                                 PROGRESS NOTE   HISTORY:  I saw the patient last night after his procedure.  GI is  recommending that he possibly be transferred to [facility].  They are still  evaluating that particular situation and await any further  recommendations they may have regarding this.  In the meantime the  patient is complaining that he still cannot eat.  He is feeling very  weak.  He states that he has not eaten for over a week.  Everything he  puts in his mouth he eventually throws up so feels like he is not  getting any nutrition.  I discussed with wife and patient regarding the  possibility of maybe feeding him parenterally with possible TPN and that  we may need to either get a central line placed at least if he is  transferred to [facility] he will be able to get some nutrition and if  there is a delay in procedure he will be able to at least receive some  type of nutritional supplementation.  I told him I would speak with the  surgeon and based on that would get back with them.  I spoke with Dr.  Leticia Penna and he will talk to the patient regarding this possibility.   PHYSICAL EXAMINATION:  VITAL SIGNS:  His vitals today are as follows:  97.3, pulse 74, respirations 16, blood pressure 105/69.  GENERAL:  This is a 55 year old male who is sitting in bed in no acute  distress.  HEART:  Regular rate and rhythm.  LUNGS:  Clear to auscultation bilaterally.  ABDOMEN:  Flat, nontender, nondistended.  EXTREMITIES:  Positive pulses.  No edema or cyanosis noted.   LABS:  None for today.   ASSESSMENT AND PLAN:  1. Achalasia.  This is being followed by GI and await further      recommendations  regarding transfer for testing or for further      evaluation.  2. Poor nutritional intake.  Will go ahead and have dietary consult as      well to give what his caloric needs will be and then will have      surgery evaluate him for placement of central line to obtain this      as well.  We will continue to monitor patient and change any      management that is needed based on monitoring.      Dorris Singh, DO  Electronically Signed     CB/MEDQ  D:  08/08/2007  T:  08/08/2007  Job:  [id number]

## 2011-01-08 NOTE — Op Note (Signed)
NAMEMIKING, USREY NO.:  [id number]   MEDICAL RECORD NO.:  [id number]          PATIENT TYPE:  INP   LOCATION:  A212                          FACILITY:  [facility]   PHYSICIAN:  Tilford Pillar, MD      DATE OF BIRTH:  1955-09-30   DATE OF PROCEDURE:  08/08/2007  DATE OF DISCHARGE:  08/09/2007                               OPERATIVE REPORT   PREPROCEDURE DIAGNOSIS:  Nonnutritional, desiring partial nutrition  support.   POSTPROCEDURE DIAGNOSIS:  Nonnutritional, desiring partial nutrition  support.   PROCEDURE:  Left subclavian vein triple-lumen catheter placement.   SURGEON:  Tilford Pillar, M.D.   ANESTHETIC:  One percent lidocaine plain.   ESTIMATED BLOOD LOSS:  Scant.   COMPLICATIONS:  None.   INDICATIONS:  The patient is a 55 year old male recently diagnosed with  achalasia, unable to tolerate p.o. nutritional support.  He is being  advised to begin __________  nutritional support per his admitting  physician for the planned procedure.  After risks, benefits, and  alternatives were discussed with the patient, he agreed to proceed with  placement at this time.   PROCEDURE:  The patient was placed in the Trendelenburg position in his  hospital bed.  His left shoulder and neck were prepped with __________  solution.  At this time, sterile drapes were placed using sterile  technique.  One percent lidocaine was injected.  At this point, an 4-  gauge introducer needle was utilized to identify the subclavian vein.  Good venous return was obtained.  A wire was advanced.  Seldinger  technique was then utilized to place a triple-lumen catheter into the  left subclavian vein to 18 cm.  This was suture secured to the skin  after confirming that  __________ .  The patient tolerated the procedure well, without any  complications.  A sterile dressing was placed, and drapes were removed.  All sharps were disposed of accordingly.  At this point, a stat portable  chest  x-ray was ordered for confirmation of placement and to ensure no  pneumothorax.      Tilford Pillar, MD  Electronically Signed     BZ/MEDQ  D:  08/08/2007  T:  08/10/2007  Job:  [id number]   cc:   Angus G. Renard Matter, MD  Fax: [phone]

## 2011-01-08 NOTE — H&P (Signed)
NAMEGILMER, KAMINSKY NO.:  [id number]   MEDICAL RECORD NO.:  [id number]          PATIENT TYPE:  INP   LOCATION:  A212                          FACILITY:  [facility]   PHYSICIAN:  Dorris Singh, DO    DATE OF BIRTH:  [birth year]   DATE OF ADMISSION:  08/06/2007  DATE OF DISCHARGE:  LH                              HISTORY & PHYSICAL   PRIMARY CARE PHYSICIAN:  He is currently unassigned.   HISTORY OF PRESENT ILLNESS:  The patient is a 55 year old Caucasian male  who presented to the emergency room today after being discharged from  [facility] after cardiac catheterization.  He was transferred from [facility] to [facility] for cardiac cath which was found to be negative. When  he returned home he continued to vomit and was unable to keep any kind  of food down.  His wife then called Dr. Cira Servant who told him to come back  to the emergency room to be seen.  At this point in time they decided to  do an emergent scope and patient was admitted to the hospital.  He was  admitted for dysphagia and possible achalasia.   PAST MEDICAL HISTORY:  Significant for esophageal stricture and had  dilation more than 10 years ago.  He has a long standing history of  dysphagia and achalasia. Chronic back pain.   SOCIAL HISTORY:  He lives with his wife.  He used to work in a [employer]. Quit smoking 10 years ago.  He had a 30 pack year history of  smoking. No ETOH abuse or illicit drug use.   FAMILY HISTORY:  Had seizure disorder and heart disease in his family as  well as COPD and cardiac disease.   REVIEW OF SYSTEMS:  Positive for irretractable nausea and vomiting as  well as dysphagia and chest pain associated with it.   PHYSICAL EXAMINATION:  VITAL SIGNS:  Blood pressure 103/79, pulse rate  90, respirations 17, temperature 97.1 and O2 saturation of 98%.  GENERAL:  This is a thin, well-developed individual in no acute  distress.  HEENT:  No pallor or icterus. Membranes moist,  no oral lesions noted.  NECK:  Supple, no thyromegaly.  LUNGS:  Clear to auscultation bilaterally.  CARDIOVASCULAR:  S1 and S2 with no murmur appreciated.  ABDOMEN:  Soft, nontender, nondistended, bowel sounds present.  EXTREMITIES:  No edema.  Peripheral pulses are palpated.   LABS DONE IN THE EMERGENCY DEPARTMENT:  He had an EKG done which was  normal.  He had a hepatic function panel which was normal, lipase was  34. Cardiac markers were within normal limits.  His BNP demonstrated a  low potassium.  His CBC was within normal limits.   ASSESSMENT AND PLAN:  1. Irretractable nausea and vomiting.  2. Dysphagia.  3. Achalasia.  4. Chest pain.  5. Hypokalemia.   PLAN:  Admit to the service of [facility] with consulting GI who will  schedule an EGD for patient in the morning.  Will keep him n.p.o.,  continue to monitor and hydrate him, will  replace his potassium, put him  on GI and DVT prophylaxis.  Continue to monitor and change therapy as  needed.      Dorris Singh, DO  Electronically Signed     CB/MEDQ  D:  08/06/2007  T:  08/06/2007  Job:  [id number]

## 2011-01-11 NOTE — Op Note (Signed)
Walter Herring, Walter Herring                ACCOUNT NO.:  [id number]   MEDICAL RECORD NO.:  [id number]          PATIENT TYPE:  AMB   LOCATION:  DAY                           FACILITY:  [facility]   PHYSICIAN:  Kassie Mends, M.D.      DATE OF BIRTH:  06-28-1956   DATE OF PROCEDURE:  04/27/2009  DATE OF DISCHARGE:  04/25/2009                               OPERATIVE REPORT   PROCEDURE:  Hydrogen breath test.   REFERRING Jazzlin Clements:  Melvyn Novas, MD   INDICATION FOR EXAM:  Walter Herring is a 55 year old male, who has  achalasia.  He complains of epigastric abdominal pain, nausea, vomiting,  and flatulence.  He had an EGD with Maloney dilation in 2008 and  colonoscopy in 2009 which was normal.  He had a normal hepatic function  panel and lipase in August 2010.  CT scan of the abdomen with and  without IV contrast showed no evidence of pancreatic mass.   PREPROCEDURE CHECK:  The patient arrived ambulatory.  He drank 25 g of  lactulose at 0700.  He denied any pain or discomfort.   FINDINGS:  The initial breath breathalyzer read four parts per million.  He had a peak at 9.  He had peak at 1 hour at 16 parts per million.  A  trough at 9:30 at 6 parts per million and a second peak at 2 hours at 12  parts per million.   DIAGNOSIS:  No evidence of small bowel bacterial overgrowth.   RECOMMENDATIONS:  Will titrate Walter Herring paroxetine.  He likely has  non-ulcer dyspepsia or a functional bowel disorder.      Kassie Mends, M.D.  Electronically Signed     SM/MEDQ  D:  04/27/2009  T:  04/28/2009  Job:  [id number]   cc:   Melvyn Novas, MD  Fax: [phone]

## 2011-01-17 ENCOUNTER — Telehealth: Payer: Self-pay

## 2011-01-17 NOTE — Telephone Encounter (Signed)
Please call [place]. Pt can take capsules.

## 2011-01-17 NOTE — Telephone Encounter (Signed)
LMOM pt can take capsules.

## 2011-01-17 NOTE — Telephone Encounter (Signed)
T/C from [employer], needs to confirm if Rx is supposed to be for packet or capsule. She can be reached at [phone] x [phone].

## 2011-02-08 ENCOUNTER — Emergency Department (HOSPITAL_COMMUNITY)
Admission: EM | Admit: 2011-02-08 | Discharge: 2011-02-08 | Disposition: A | Discharge status: Home / Self Care | Payer: Medicare Other | Attending: Emergency Medicine | Admitting: Emergency Medicine

## 2011-02-08 ENCOUNTER — Emergency Department (HOSPITAL_COMMUNITY): Payer: Medicare Other

## 2011-02-08 DIAGNOSIS — R5383 Other fatigue: Secondary | ICD-10-CM | POA: Insufficient documentation

## 2011-02-08 DIAGNOSIS — E876 Hypokalemia: Secondary | ICD-10-CM | POA: Insufficient documentation

## 2011-02-08 DIAGNOSIS — J438 Other emphysema: Secondary | ICD-10-CM | POA: Insufficient documentation

## 2011-02-08 DIAGNOSIS — K219 Gastro-esophageal reflux disease without esophagitis: Secondary | ICD-10-CM | POA: Insufficient documentation

## 2011-02-08 DIAGNOSIS — K222 Esophageal obstruction: Secondary | ICD-10-CM | POA: Insufficient documentation

## 2011-02-08 DIAGNOSIS — R5381 Other malaise: Secondary | ICD-10-CM | POA: Insufficient documentation

## 2011-02-08 LAB — CBC
MCH: 30.5 pg (ref 26.0–34.0)
MCHC: 33.4 g/dL (ref 30.0–36.0)
MCV: 91.2 fL (ref 78.0–100.0)
Platelets: 256 10*3/uL (ref 150–400)

## 2011-02-08 LAB — DIFFERENTIAL
Basophils Relative: 0 % (ref 0–1)
Eosinophils Absolute: 0.2 10*3/uL (ref 0.0–0.7)
Eosinophils Relative: 3 % (ref 0–5)
Lymphs Abs: 1.5 10*3/uL (ref 0.7–4.0)
Monocytes Absolute: 0.4 10*3/uL (ref 0.1–1.0)
Monocytes Relative: 6 % (ref 3–12)

## 2011-02-08 LAB — URINALYSIS, ROUTINE W REFLEX MICROSCOPIC
Hgb urine dipstick: NEGATIVE
Leukocytes, UA: NEGATIVE
Nitrite: NEGATIVE
Protein, ur: 30 mg/dL — AB
Specific Gravity, Urine: 1.025 (ref 1.005–1.030)
Urobilinogen, UA: 0.2 mg/dL (ref 0.0–1.0)

## 2011-02-08 LAB — BASIC METABOLIC PANEL
CO2: 29 mEq/L (ref 19–32)
Chloride: 101 mEq/L (ref 96–112)
Creatinine, Ser: 0.99 mg/dL (ref 0.50–1.35)
GFR calc Af Amer: >60 mL/min (ref >60)
Potassium: 3.1 mEq/L — ABNORMAL LOW (ref 3.5–5.1)
Sodium: 140 mEq/L (ref 135–145)

## 2011-02-08 LAB — URINE MICROSCOPIC-ADD ON

## 2011-02-10 LAB — TSH: TSH: 1.295 u[IU]/mL (ref 0.350–4.500)

## 2011-04-03 ENCOUNTER — Ambulatory Visit (INDEPENDENT_AMBULATORY_CARE_PROVIDER_SITE_OTHER): Payer: Medicare Other | Admitting: Psychiatry

## 2011-04-03 DIAGNOSIS — F322 Major depressive disorder, single episode, severe without psychotic features: Secondary | ICD-10-CM

## 2011-04-10 ENCOUNTER — Ambulatory Visit (INDEPENDENT_AMBULATORY_CARE_PROVIDER_SITE_OTHER): Payer: Medicare Other | Admitting: Gastroenterology

## 2011-04-10 ENCOUNTER — Encounter: Payer: Self-pay | Admitting: Gastroenterology

## 2011-04-10 DIAGNOSIS — K220 Achalasia of cardia: Secondary | ICD-10-CM

## 2011-04-10 DIAGNOSIS — R1319 Other dysphagia: Secondary | ICD-10-CM

## 2011-04-10 DIAGNOSIS — K5900 Constipation, unspecified: Secondary | ICD-10-CM | POA: Insufficient documentation

## 2011-04-10 MED ORDER — POLYETHYLENE GLYCOL 3350 17 GM/SCOOP PO POWD: ORAL | Status: AC

## 2011-04-10 MED ORDER — OMEPRAZOLE 20 MG PO CPDR: DELAYED_RELEASE_CAPSULE | ORAL | Status: DC

## 2011-04-10 MED ORDER — PROMETHAZINE HCL 25 MG PO TABS: ORAL_TABLET | ORAL | Status: DC

## 2011-04-10 NOTE — Patient Instructions (Signed)
Increase omeprazole to 30 minutes before meals twice a day. Use Phenergan as needed for nausea and vomiting. Use MIRALAX ONCE A DAY FOR 7 DAYS and if stools are still hard then use Miralax twice daily. EAT 4-6 SMALL MEALS A DAY. FOLLOW A SOFT MECHANICAL DIET. DRINK ENSURE OR BOOST THREE TIMES A DAY.    SOFT MECHANICAL DIET This SOFT MECHANICAL Diet is restricted to:  Foods that are moist, soft-textured, and easy to chew and swallow.   Meats that are ground or are minced no larger than one-quarter inch pieces. Meats are moist with gravy or sauce added.   Foods that do not include bread or bread-like textures except soft pancakes, well-moistened with syrup or sauce.   Textures with some chewing ability required.   Casseroles without rice.   Cooked vegetables that are less than half an inch in size and easily mashed with a fork. No cooked corn, peas, broccoli, cauliflower, cabbage, Brussels sprouts, asparagus, or other fibrous, non-tender or rubbery cooked vegetables.   Canned fruit except for pineapple. Fruit must be cut into pieces no larger than half an inch in size.   Foods that do not include nuts, seeds, coconut, or sticky textures.    FOOD TEXTURES FOR SOFT MECHANICAL DIET  FOOD GROUP: Breads. RECOMMENDED: Soft pancakes, well-moistened with syrup or sauce. AVOID: All others. FOOD GROUP: Cereals. RECOMMENDED: Cooked cereals with little texture, including oatmeal. Unprocessed wheat bran stirred into cereals for bulk. Note: If thin liquids are restricted, it is important that all of the liquid is absorbed into the cereal. AVOID: All dry cereals and any cooked cereals that may contain flax seeds or other seeds or nuts. Whole-grain, dry, or coarse cereals. Cereals with nuts, seeds, dried fruit, and/or coconut. FOOD GROUP: Desserts. RECOMMENDED: Pudding, custard. Soft fruit pies with bottom crust only. Canned fruit (excluding pineapple). Soft, moist cakes with icing, Frozen malts,  milk shakes, frozen yogurt, eggnog, nutritional supplements, ice cream, sherbet, regular or sugar-free gelatin AVOID: Dry, coarse cakes and cookies. Anything with nuts, seeds, coconut, pineapple, or dried fruit. Breakfast yogurt with nuts. Rice or bread pudding. FOOD GROUP: Fats. RECOMMENDED: Butter, margarine, cream for cereal (depending on liquid consistency recommendations), gravy, cream sauces, sour cream, sour cream dips with soft additives, mayonnaise, salad dressings, cream cheese, cream cheese spreads with soft additives, whipped toppings. AVOID: All fats with coarse or chunky additives. FOOD GROUP: Fruits. RECOMMENDED: Soft drained, canned, or cooked fruits without seeds or skin. Fresh soft and ripe banana. Fruit juices with a small amount of pulp. If thin liquids are restricted, fruit juices should be thickened to appropriate consistency. AVOID: Fresh or frozen fruits. Cooked fruit with skin or seeds. Dried fruits. Fresh, canned, or cooked pineapple. FOOD GROUP: Meats and Meat Substitutes. (Meat pieces should not exceed 1/4 of an inch cube and should be tender.) RECOMMENDED: Moistened ground or cooked meat, poultry, or fish. Moist ground or tender meat may be served with gravy or sauce. Casseroles without rice. Moist macaroni and cheese, well-cooked pasta with meat sauce, tuna noodle casserole, soft, moist lasagna. Moist meatballs, meatloaf, or fish loaf. Protein salads, such as tuna or egg without large chunks, celery, or onion. Cottage cheese, smooth quiche without large chunks. Poached, scrambled, or soft-cooked eggs (egg yolks should not be "runny" but should be moist and able to be mashed with butter, margarine, or other moisture added to them). (Cook eggs to 160 F or use pasteurized eggs for safety.) Souffls may have small, soft chunks. Tofu. Well-cooked, slightly  mashed, moist legumes, such as baked beans. All meats or protein substitutes should be served with sauces or moistened to  help maintain cohesiveness in the oral cavity. AVOID: Dry meats, tough meats (such as bacon, sausage, hot dogs, bratwurst). Dry casseroles or casseroles with rice or large chunks. Peanut butter. Cheese slices and cubes. Hard-cooked or crisp fried eggs. Sandwiches. Pizza. FOOD GROUP: Potatoes and Starches. RECOMMENDED: Well-cooked, moistened, boiled, baked, or mashed potatoes. Well-cooked shredded hash brown potatoes that are not crisp. (All potatoes need to be moist and in sauces.)Well-cooked noodles in sauce. Spaetzel or soft dumplings that have been moistened with butter or gravy. AVOID: Potato skins and chips. Fried or French-fried potatoes. Rice. FOOD GROUP: Soups. RECOMMENDED: Soups with easy-to-chew or easy-to-swallow meats or vegetables: Particle sizes in soups should be less than 1/2 inch. Soups will need to be thickened to appropriate consistency if soup is thinner than prescribed liquid consistency. AVOID: Soups with large chunks of meat and vegetables. Soups with rice, corn, peas. FOOD GROUP: Vegetables. RECOMMENDED: All soft, well-cooked vegetables. Vegetables should be less than a half inch. Should be easily mashed with a fork. AVOID: Cooked corn and peas. Broccoli, cabbage, Brussels sprouts, asparagus, or other fibrous, non-tender or rubbery cooked vegetables. FOOD GROUP: Miscellaneous. RECOMMENDED: Jams and preserves without seeds, jelly. Sauces, salsas, etc., that may have small tender chunks less than 1/2 inch. Soft, smooth chocolate bars that are easily chewed. AVOID: Seeds, nuts, coconut, or sticky foods. Chewy candies such as caramels or licorice.

## 2011-04-10 NOTE — Assessment & Plan Note (Addendum)
Having difficulty swallowing and waking up with food on his bedding. MAR 2012 food impaction/STRICTURE noted on EGD.  EAT 4-6 SMALL MEALS A DAY. FOLLOW A SOFT MECHANICAL DIET. DRINK ENSURE OR BOOST THREE TIMES A DAY. Follow up in 4 mos. SEE PCP FOR MEDS TO SLEEP

## 2011-04-10 NOTE — Progress Notes (Signed)
Cc to PCP 

## 2011-04-10 NOTE — Assessment & Plan Note (Signed)
2o to ?stricture and Achalasia. Known Hx: GERD & Heller myotomy. PT LOSING WIEGHT.  Increase omeprazole to 30 minutes before meals twice a day. Use Phenergan as needed for nausea and vomiting. EGD/DIL SEP 7 WITH PROPOFOL DUE TO POLYPHARMACY. May need more than one session.  Consider [facility] referral if dysphagia & weight loss persists after adequate dilation.

## 2011-04-10 NOTE — Assessment & Plan Note (Signed)
Having hard stools.  Use MIRALAX ONCE A DAY FOR 7 DAYS and if stools are still hard then use Miralax twice daily.

## 2011-04-10 NOTE — Progress Notes (Signed)
Subjective:    Patient ID: Walter Herring, male    DOB: 1955/10/11, 55 y.o.   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   Pt seen in MAR 2012 c/o dysphagia. Noted to have stricture on EGD MAR 2012-s/p balloon dilation. LOST 7 LBS SINCE FEB 2012. DECREASED APPETITE. Waking up with liquid in back of throat and on pillow. Still having problems with swallowing. LAST VISIT AT [facility] 2008. HAVING PAIN in his CHEST AND ABD PAIN SOME TIMES. NO ABD PAIN TODAY.  BmS: HARD, QOD.   Past Medical History  Diagnosis Date  . DDD (degenerative disc disease)   . GERD (gastroesophageal reflux disease)   . COPD (chronic obstructive pulmonary disease)   . Hypercholesterolemia   . Anxiety   . Achalasia 2008 168 LBS  . Chronic abdominal pain     AUG 2010 CT ABD W/ IVC-NL PANCREAS, GB, LIVER    Past Surgical History  Procedure Date  . 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 CARL WESTCOTT  . Esophagogastroduodenoscopy      MAR 2012 (RMR) TTS DIL 20 MM, JAN 2010 Gastritis  . Hemorrhoid surgery   . Colonoscopy 2009 [facility]    Normal    Allergies  Allergen Reactions  . Dairy Aid (Lactase) Nausea And Vomiting    Current Outpatient Prescriptions  Medication Sig Dispense Refill  . FLUoxetine (PROZAC) 20 MG capsule Take 2 capsules (40 mg total) by mouth daily.  60 capsule  5  . HYDROcodone-acetaminophen (NORCO) 7.5-325 MG per tablet Take 1 tablet by mouth every 6 (six) hours as needed.        . lidocaine (XYLOCAINE) 2 % solution Take 10 mLs by mouth as needed.        Marland Kitchen LORazepam (ATIVAN) 2 MG tablet Take 2 mg by mouth at bedtime.        . multivitamin (THERAGRAN) per tablet Take 1 tablet by mouth daily.        . naproxen (NAPROSYN) 500 MG tablet Take 500 mg by mouth 2 (two) times daily with a meal.        . pravastatin (PRAVACHOL) 40 MG tablet Take 40 mg by mouth daily.         . promethazine (PHENERGAN) 25 MG tablet 1 PO Q8H PRN NAUSEA AND VOMITING    . omeprazole (PRILOSEC) 20 MG capsule 1 PO 30 MINUTES PRIOR TO MEALS             Review of Systems  All other systems reviewed and are negative.  AUG 2010: 208 LBS, FEB 2012 190 LBS     Objective:   Physical Exam  Vitals reviewed. Constitutional: He is oriented to person, place, and time. He appears well-developed and well-nourished. No distress.  HENT:  Head: Normocephalic and atraumatic.  Mouth/Throat: Oropharynx is clear and moist. No oropharyngeal exudate.  Eyes: Pupils are equal, round, and reactive to light. No scleral icterus.  Neck: Normal range of motion. Neck supple.  Cardiovascular: Normal rate, regular rhythm and normal heart sounds.   Pulmonary/Chest: Effort normal and breath sounds normal. No respiratory distress.  Abdominal: Soft. Bowel sounds are normal. He exhibits no distension. There is tenderness (MIDL TTP IN LUQ AND LLQ).  Musculoskeletal: He exhibits no edema.  Lymphadenopathy:    He has no cervical adenopathy.  Neurological: He is alert and oriented to person, place, and  time.       NO FOCAL DEFICITS  Skin: Skin is warm.  Psychiatric:       DEPRESSED MOOD, FLAT AFFECT          Assessment & Plan:

## 2011-04-11 ENCOUNTER — Encounter (INDEPENDENT_AMBULATORY_CARE_PROVIDER_SITE_OTHER): Payer: Medicare Other | Admitting: Psychiatry

## 2011-04-11 DIAGNOSIS — F322 Major depressive disorder, single episode, severe without psychotic features: Secondary | ICD-10-CM

## 2011-04-17 ENCOUNTER — Encounter (HOSPITAL_COMMUNITY): Payer: Medicare Other | Admitting: Psychiatry

## 2011-04-17 NOTE — Progress Notes (Signed)
Reminder in epic to follow up in Dec 2012 °

## 2011-04-22 ENCOUNTER — Encounter (INDEPENDENT_AMBULATORY_CARE_PROVIDER_SITE_OTHER): Payer: Medicare Other | Admitting: Psychiatry

## 2011-04-22 DIAGNOSIS — F322 Major depressive disorder, single episode, severe without psychotic features: Secondary | ICD-10-CM

## 2011-04-25 ENCOUNTER — Other Ambulatory Visit: Payer: Self-pay | Admitting: Gastroenterology

## 2011-04-25 DIAGNOSIS — R1319 Other dysphagia: Secondary | ICD-10-CM

## 2011-04-25 NOTE — Patient Instructions (Addendum)
20 Walter Herring  04/25/2011   Your procedure is scheduled on:  9/7/20125  Report to [facility] at  615  AM.  Call this number if you have problems the morning of surgery: [phone]   Remember:   Do not eat food:After Midnight.  Do not drink clear liquids: After Midnight.  Take these medicines the morning of surgery with A SIP OF WATER: prozac, ativan,prilosec   Do not wear jewelry, make-up or nail polish.  Do not wear lotions, powders, or perfumes. You may wear deodorant.  Do not shave 48 hours prior to surgery.  Do not bring valuables to the hospital.  Contacts, dentures or bridgework may not be worn into surgery.  Leave suitcase in the car. After surgery it may be brought to your room.  For patients admitted to the hospital, checkout time is 11:00 AM the day of discharge.   Patients discharged the day of surgery will not be allowed to drive home.  Name and phone number of your driver: family  Special Instructions: N/A   Please read over the following fact sheets that you were given: Pain Booklet, Surgical Site Infection Prevention, Anesthesia Post-op Instructions and Care and Recovery After Surgery PATIENT INSTRUCTIONS POST-ANESTHESIA  IMMEDIATELY FOLLOWING SURGERY:  Do not drive or operate machinery for the first twenty four hours after surgery.  Do not make any important decisions for twenty four hours after surgery or while taking narcotic pain medications or sedatives.  If you develop intractable nausea and vomiting or a severe headache please notify your doctor immediately.  FOLLOW-UP:  Please make an appointment with your surgeon as instructed. You do not need to follow up with anesthesia unless specifically instructed to do so.  WOUND CARE INSTRUCTIONS (if applicable):  Keep a dry clean dressing on the anesthesia/puncture wound site if there is drainage.  Once the wound has quit draining you may leave it open to air.  Generally you should leave the bandage intact for twenty  four hours unless there is drainage.  If the epidural site drains for more than 36-48 hours please call the anesthesia department.  QUESTIONS?:  Please feel free to call your physician or the hospital operator if you have any questions, and they will be happy to assist you.     [facility] Anesthesia Department [address] [place] [phone]

## 2011-04-26 ENCOUNTER — Encounter (HOSPITAL_COMMUNITY): Payer: Self-pay

## 2011-04-26 ENCOUNTER — Encounter (HOSPITAL_COMMUNITY): Admission: RE | Admit: 2011-04-26 | Discharge: 2011-04-26 | Payer: Medicare Other | Source: Ambulatory Visit

## 2011-04-26 ENCOUNTER — Other Ambulatory Visit: Payer: Self-pay

## 2011-04-26 ENCOUNTER — Encounter (HOSPITAL_COMMUNITY)
Admission: RE | Admit: 2011-04-26 | Discharge: 2011-04-26 | Disposition: A | Discharge status: Home / Self Care | Payer: Medicare Other | Source: Ambulatory Visit | Attending: Gastroenterology | Admitting: Gastroenterology

## 2011-04-26 HISTORY — DX: Pure hypercholesterolemia, unspecified: E78.00

## 2011-04-26 LAB — CBC
Hemoglobin: 13.2 g/dL (ref 13.0–17.0)
MCHC: 33.7 g/dL (ref 30.0–36.0)
WBC: 6.9 10*3/uL (ref 4.0–10.5)

## 2011-04-26 LAB — BASIC METABOLIC PANEL
Chloride: 103 mEq/L (ref 96–112)
GFR calc Af Amer: >60 mL/min (ref >60)
GFR calc non Af Amer: >60 mL/min (ref >60)
Glucose, Bld: 87 mg/dL (ref 70–99)
Potassium: 4 mEq/L (ref 3.5–5.1)
Sodium: 140 mEq/L (ref 135–145)

## 2011-05-02 ENCOUNTER — Ambulatory Visit (INDEPENDENT_AMBULATORY_CARE_PROVIDER_SITE_OTHER): Payer: Medicare Other | Admitting: Psychiatry

## 2011-05-02 DIAGNOSIS — F331 Major depressive disorder, recurrent, moderate: Secondary | ICD-10-CM

## 2011-05-03 ENCOUNTER — Encounter (HOSPITAL_COMMUNITY)
Admission: RE | Disposition: A | Discharge status: Home / Self Care | Payer: Self-pay | Source: Ambulatory Visit | Attending: Gastroenterology

## 2011-05-03 ENCOUNTER — Encounter (HOSPITAL_COMMUNITY): Payer: Self-pay | Admitting: *Deleted

## 2011-05-03 ENCOUNTER — Ambulatory Visit (HOSPITAL_COMMUNITY): Payer: Medicare Other | Admitting: Anesthesiology

## 2011-05-03 ENCOUNTER — Encounter (HOSPITAL_COMMUNITY): Payer: Self-pay | Admitting: Anesthesiology

## 2011-05-03 ENCOUNTER — Other Ambulatory Visit: Payer: Self-pay | Admitting: Gastroenterology

## 2011-05-03 ENCOUNTER — Ambulatory Visit (HOSPITAL_COMMUNITY)
Admission: RE | Admit: 2011-05-03 | Discharge: 2011-05-03 | Disposition: A | Discharge status: Home / Self Care | Payer: Medicare Other | Source: Ambulatory Visit | Attending: Gastroenterology | Admitting: Gastroenterology

## 2011-05-03 ENCOUNTER — Telehealth: Payer: Self-pay | Admitting: Gastroenterology

## 2011-05-03 DIAGNOSIS — K222 Esophageal obstruction: Secondary | ICD-10-CM

## 2011-05-03 DIAGNOSIS — R634 Abnormal weight loss: Secondary | ICD-10-CM

## 2011-05-03 DIAGNOSIS — K2970 Gastritis, unspecified, without bleeding: Secondary | ICD-10-CM

## 2011-05-03 DIAGNOSIS — R1310 Dysphagia, unspecified: Secondary | ICD-10-CM | POA: Insufficient documentation

## 2011-05-03 DIAGNOSIS — K2990 Gastroduodenitis, unspecified, without bleeding: Secondary | ICD-10-CM

## 2011-05-03 DIAGNOSIS — Z01812 Encounter for preprocedural laboratory examination: Secondary | ICD-10-CM | POA: Insufficient documentation

## 2011-05-03 DIAGNOSIS — Z01810 Encounter for preprocedural cardiovascular examination: Secondary | ICD-10-CM | POA: Insufficient documentation

## 2011-05-03 HISTORY — PX: SAVORY DILATION: SHX5439

## 2011-05-03 SURGERY — ESOPHAGOGASTRODUODENOSCOPY (EGD) WITH PROPOFOL
Anesthesia: Monitor Anesthesia Care

## 2011-05-03 MED ORDER — OMEPRAZOLE 20 MG PO CPDR: DELAYED_RELEASE_CAPSULE | ORAL | Status: DC

## 2011-05-03 MED ORDER — GLYCOPYRROLATE 0.2 MG/ML IJ SOLN
0.2000 mg | Freq: Once | INTRAMUSCULAR | Status: AC | PRN
  Administered 2011-05-03: 0.2 mg via INTRAVENOUS

## 2011-05-03 MED ORDER — LIDOCAINE HCL 1 % IJ SOLN
INTRAMUSCULAR | Status: DC | PRN
  Administered 2011-05-03: 20 mg via INTRADERMAL

## 2011-05-03 MED ORDER — MIDAZOLAM HCL 2 MG/2ML IJ SOLN
1.0000 mg | INTRAMUSCULAR | Status: DC | PRN
  Administered 2011-05-03 (×2): 2 mg via INTRAVENOUS

## 2011-05-03 MED ORDER — GLYCOPYRROLATE 0.2 MG/ML IJ SOLN
INTRAMUSCULAR | Status: AC
  Filled 2011-05-03: qty 1

## 2011-05-03 MED ORDER — MIDAZOLAM HCL 2 MG/2ML IJ SOLN
INTRAMUSCULAR | Status: AC
  Filled 2011-05-03: qty 2

## 2011-05-03 MED ORDER — PROPOFOL 10 MG/ML IV EMUL
INTRAVENOUS | Status: AC
  Filled 2011-05-03: qty 20

## 2011-05-03 MED ORDER — FENTANYL CITRATE 0.05 MG/ML IJ SOLN
INTRAMUSCULAR | Status: AC
  Filled 2011-05-03: qty 2

## 2011-05-03 MED ORDER — LIDOCAINE HCL (PF) 1 % IJ SOLN
INTRAMUSCULAR | Status: AC
  Filled 2011-05-03: qty 5

## 2011-05-03 MED ORDER — ONDANSETRON HCL 4 MG/2ML IJ SOLN
INTRAMUSCULAR | Status: AC
  Administered 2011-05-03: 4 mg
  Filled 2011-05-03: qty 2

## 2011-05-03 MED ORDER — PROPOFOL 10 MG/ML IV EMUL
INTRAVENOUS | Status: DC | PRN
  Administered 2011-05-03: 25 ug/kg/min via INTRAVENOUS

## 2011-05-03 MED ORDER — ONDANSETRON HCL 4 MG/2ML IJ SOLN: 4.0000 mg | Freq: Once | INTRAMUSCULAR | Status: DC | PRN

## 2011-05-03 MED ORDER — LACTATED RINGERS IV SOLN: INTRAVENOUS | Status: DC

## 2011-05-03 MED ORDER — FENTANYL CITRATE 0.05 MG/ML IJ SOLN: 25.0000 ug | INTRAMUSCULAR | Status: DC | PRN

## 2011-05-03 MED ORDER — BUTAMBEN-TETRACAINE-BENZOCAINE 2-2-14 % EX AERO
2.0000 | INHALATION_SPRAY | Freq: Once | CUTANEOUS | Status: AC
  Administered 2011-05-03: 2 via TOPICAL
  Filled 2011-05-03: qty 56

## 2011-05-03 MED ORDER — ACETAMINOPHEN 325 MG PO TABS: 325.0000 mg | ORAL_TABLET | ORAL | Status: DC | PRN

## 2011-05-03 MED ORDER — STERILE WATER FOR IRRIGATION IR SOLN
Status: DC | PRN
  Administered 2011-05-03: 08:00:00

## 2011-05-03 MED ORDER — FENTANYL CITRATE 0.05 MG/ML IJ SOLN
INTRAMUSCULAR | Status: DC | PRN
  Administered 2011-05-03 (×2): 50 ug via INTRAVENOUS

## 2011-05-03 MED ORDER — LACTATED RINGERS IV SOLN
INTRAVENOUS | Status: DC
  Administered 2011-05-03: 1000 mL via INTRAVENOUS

## 2011-05-03 MED ORDER — ONDANSETRON HCL 4 MG/2ML IJ SOLN: 4.0000 mg | Freq: Once | INTRAMUSCULAR | Status: DC

## 2011-05-03 SURGICAL SUPPLY — 18 items
BLOCK BITE 60FR ADLT L/F BLUE (MISCELLANEOUS) ×2 IMPLANT
ELECT REM PT RETURN 9FT ADLT (ELECTROSURGICAL)
ELECTRODE REM PT RTRN 9FT ADLT (ELECTROSURGICAL) IMPLANT
FLOOR PAD 36X40 (MISCELLANEOUS) ×2
FORCEP RJ3 GP 1.8X160 W-NEEDLE (CUTTING FORCEPS) IMPLANT
FORCEPS BIOP RAD 4 LRG CAP 4 (CUTTING FORCEPS) ×1 IMPLANT
NDL SCLEROTHERAPY 25GX240 (NEEDLE) IMPLANT
NEEDLE SCLEROTHERAPY 25GX240 (NEEDLE) IMPLANT
PAD FLOOR 36X40 (MISCELLANEOUS) ×1 IMPLANT
PROBE APC STR FIRE (PROBE) IMPLANT
PROBE INJECTION GOLD (MISCELLANEOUS)
PROBE INJECTION GOLD 7FR (MISCELLANEOUS) IMPLANT
SNARE ROTATE MED OVAL 20MM (MISCELLANEOUS) IMPLANT
SNARE SHORT THROW 13M SML OVAL (MISCELLANEOUS) IMPLANT
SYR 50ML LL SCALE MARK (SYRINGE) ×1 IMPLANT
TUBING ENDO SMARTCAP PENTAX (MISCELLANEOUS) ×4 IMPLANT
TUBING IRRIGATION ENDOGATOR (MISCELLANEOUS) ×2 IMPLANT
WATER STERILE IRR 1000ML POUR (IV SOLUTION) ×2 IMPLANT

## 2011-05-03 NOTE — Anesthesia Preprocedure Evaluation (Signed)
Anesthesia Evaluation  Name, MR# and DOB Patient awake  General Assessment Comment  Reviewed: Allergy & Precautions, H&P , NPO status , Patient's Chart, lab work & pertinent test results  Airway Mallampati: I TM Distance: >3 FB Neck ROM: Full    Dental  (+) Edentulous Upper and Edentulous Lower   Pulmonary  COPD  clear to auscultation  breath sounds clear to auscultation none    Cardiovascular Regular Normal    Neuro/Psych    (+) Anxiety,    GI/Hepatic/Renal   negative Liver ROS  negative Renal ROS   GERD Poorly Controlled     Endo/Other  Negative Endocrine ROS (+)      Abdominal Normal abdominal exam  (+)   Musculoskeletal negative musculoskeletal ROS (+)   Hematology negative hematology ROS (+)   Peds  Reproductive/Obstetrics    Anesthesia Other Findings             Anesthesia Physical Anesthesia Plan  ASA: III  Anesthesia Plan: MAC   Post-op Pain Management:    Induction: Intravenous  Airway Management Planned: Nasal Cannula  Additional Equipment:   Intra-op Plan:   Post-operative Plan:   Informed Consent: I have reviewed the patients History and Physical, chart, labs and discussed the procedure including the risks, benefits and alternatives for the proposed anesthesia with the patient or authorized representative who has indicated his/her understanding and acceptance.     Plan Discussed with: CRNA  Anesthesia Plan Comments:         Anesthesia Quick Evaluation

## 2011-05-03 NOTE — Transfer of Care (Signed)
Immediate Anesthesia Transfer of Care Note  Patient: Walter Herring  Procedure(s) Performed:  ESOPHAGOGASTRODUODENOSCOPY (EGD) WITH PROPOFOL - 7:30; SAVORY DILATION - dilated to 17mm  Patient Location: PACU  Anesthesia Type: MAC  Level of Consciousness: awake  Airway & Oxygen Therapy: Patient Spontanous Breathing. Nasal cannula  Post-op Assessment: Report given to PACU RN, Post -op Vital signs reviewed and stable and Patient moving all extremities  Post vital signs: Reviewed and stable  Complications: No apparent anesthesia complications

## 2011-05-03 NOTE — H&P (Signed)
Reason for Visit     Follow-up    wt loss,would like something to help rest at night       Reason For Visit History Recorded        Current Vitals       Recorded User        04/10/2011  9:20 AM  Ginger L Walker, NT           BP Pulse Temp (Src) Resp Ht Wt    115/74  102  97.5 F (36.4 C) (Temporal)  N/A  6\' 1"  (1.854 m)  183 lb (83.008 kg)       BMI SpO2 PF    24.14 kg/m2  N/A  N/A          Progress Notes     [name], MD  04/10/2011 10:24 AM  Signed    Subjective:      Patient ID: Walter Herring, male    DOB: 19-Dec-1955, 55 y.o.   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    Pt seen in MAR 2012 c/o dysphagia. Noted to have stricture on EGD MAR 2012-s/p balloon dilation. LOST 7 LBS SINCE FEB 2012. DECREASED APPETITE. Waking up with liquid in back of throat and on pillow. Still having problems with swallowing. LAST VISIT AT [facility] 2008. HAVING PAIN in his CHEST AND ABD PAIN SOME TIMES. NO ABD PAIN TODAY.  BmS: HARD, QOD.     Past Medical History   Diagnosis  Date   .  DDD (degenerative disc disease)     .  GERD (gastroesophageal reflux disease)     .  COPD (chronic obstructive pulmonary disease)     .  Hypercholesterolemia     .  Anxiety     .  Achalasia  2008 168 LBS   .  Chronic abdominal pain         AUG 2010 CT ABD W/ IVC-NL PANCREAS, GB, LIVER       Past Surgical History   Procedure  Date   .  Heller myotomy  DEC 2008 DR. CARL WESTCOTT   .  Esophagogastroduodenoscopy          MAR 2012 ([facility]) TTS DIL 20 MM, JAN 2010 Gastritis   .  Hemorrhoid surgery     .  Colonoscopy  2009 [facility]       Normal       Allergies   Allergen  Reactions   .  Dairy Aid (Lactase)  Nausea And Vomiting       Current Outpatient Prescriptions   Medication  Sig  Dispense  Refill   .  FLUoxetine (PROZAC) 20 MG capsule   Take 2 capsules (40 mg total) by mouth daily.   60 capsule   5   .  HYDROcodone-acetaminophen (NORCO) 7.5-325 MG per tablet  Take 1 tablet by mouth every 6 (six) hours as needed.           .  lidocaine (XYLOCAINE) 2 % solution  Take 10 mLs by mouth as needed.           Marland Kitchen  LORazepam (ATIVAN) 2 MG tablet  Take 2 mg by mouth at bedtime.           .  multivitamin (THERAGRAN) per tablet  Take 1 tablet by mouth daily.           Marland Kitchen  naproxen (NAPROSYN) 500 MG tablet  Take 500 mg by mouth 2 (two) times daily with a meal.           .  pravastatin (PRAVACHOL) 40 MG tablet  Take 40 mg by mouth daily.           .  promethazine (PHENERGAN) 25 MG tablet  1 PO Q8H PRN NAUSEA AND VOMITING       .  omeprazole (PRILOSEC) 20 MG capsule  1 PO 30 MINUTES PRIOR TO MEALS                         Review of Systems  All other systems reviewed and are negative. AUG 2010: 208 LBS, FEB 2012 190 LBS     Objective:    Physical Exam  Vitals reviewed. Constitutional: He is oriented to person, place, and time. He appears well-developed and well-nourished. No distress.  HENT:   Head: Normocephalic and atraumatic.   Mouth/Throat: Oropharynx is clear and moist. No oropharyngeal exudate.  Eyes: Pupils are equal, round, and reactive to light. No scleral icterus.  Neck: Normal range of motion. Neck supple.  Cardiovascular: Normal rate, regular rhythm and normal heart sounds.   Pulmonary/Chest: Effort normal and breath sounds normal. No respiratory distress.  Abdominal: Soft. Bowel sounds are normal. He exhibits no distension. There is tenderness (MIDL TTP IN LUQ AND LLQ).  Musculoskeletal: He exhibits no edema.  Lymphadenopathy:    He has no cervical adenopathy.  Neurological: He is alert and oriented to person, place, and time.       NO FOCAL DEFICITS  Skin: Skin is warm.  Psychiatric:       DEPRESSED MOOD, FLAT AFFECT            Assessment & Plan:        Glendora Score  04/10/2011 11:16 AM   Signed Cc to PCP  Ferne Reus  04/17/2011 11:18 AM  Signed Reminder in epic to follow up in Dec 2012        Achalasia - [name], MD  04/10/2011 10:24 AM  Addendum Having difficulty swallowing and waking up with food on his bedding. MAR 2012 food impaction/STRICTURE noted on EGD.   EAT 4-6 SMALL MEALS A DAY. FOLLOW A SOFT MECHANICAL DIET. DRINK ENSURE OR BOOST THREE TIMES A DAY. Follow up in 4 mos. SEE PCP FOR MEDS TO SLEEP  Previous Version  DYSPHAGIA - [name], MD  04/10/2011 10:20 AM  Signed 2o to ?stricture and Achalasia. Known Hx: GERD & Heller myotomy. PT LOSING WIEGHT.   Increase omeprazole to 30 minutes before meals twice a day. Use Phenergan as needed for nausea and vomiting. EGD/DIL SEP 7 WITH PROPOFOL DUE TO POLYPHARMACY. May need more than one session.   Consider [facility] referral if dysphagia & weight loss persists after adequate dilation.     Constipation - [name], MD  04/10/2011 10:21 AM  Signed Having hard stools.   Use MIRALAX ONCE A DAY FOR 7 DAYS and if stools are still hard then use Miralax twice daily.

## 2011-05-03 NOTE — Interval H&P Note (Signed)
History and Physical Interval Note:   05/03/2011   8:01 AM   Walter Herring  has presented today for surgery, with the diagnosis of Wt loss & dysphagia  The various methods of treatment have been discussed with the patient and family. After consideration of risks, benefits and other options for treatment, the patient has consented to  Procedure(s): ESOPHAGOGASTRODUODENOSCOPY (EGD) WITH PROPOFOL SAVORY DILATION MALONEY DILATION as a surgical intervention .  I have reviewed the patients' chart and labs.  Questions were answered to the patient's satisfaction.     [name]  MD
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
## 2011-05-03 NOTE — Telephone Encounter (Signed)
Dr Darrick Penna called and requested pt be set up for a barium swallow, with no pill study- pt is scheduled for 09/12/ @ 8am. This info was given to Pam from day surgery to put on his discharge papers.

## 2011-05-03 NOTE — Anesthesia Postprocedure Evaluation (Signed)
Anesthesia Post Note  Patient: Walter Herring  Procedure(s) Performed:  ESOPHAGOGASTRODUODENOSCOPY (EGD) WITH PROPOFOL - 7:30; SAVORY DILATION - dilated to 17mm  Anesthesia type: MAC  Patient location: PACU  Post pain: Pain level controlled  Post assessment: Post-op Vital signs reviewed, Patient's Cardiovascular Status Stable, Respiratory Function Stable, Patent Airway, No signs of Nausea or vomiting and Pain level controlled  Last Vitals:  Filed Vitals:   05/03/11 0847  BP: 104/65  Temp: 99 F (37.2 C)  Resp: 14    Post vital signs: Reviewed and stable  Level of consciousness: awake and alert   Complications: No apparent anesthesia complications

## 2011-05-07 ENCOUNTER — Telehealth: Payer: Self-pay | Admitting: Gastroenterology

## 2011-05-07 NOTE — Telephone Encounter (Signed)
Please call pt. His stomach Bx shows mild gastritis. Continue OMP 30 minutes prior to meals.

## 2011-05-07 NOTE — Telephone Encounter (Signed)
Results Cc to PCP  

## 2011-05-07 NOTE — Telephone Encounter (Signed)
Informed pts wife

## 2011-05-08 ENCOUNTER — Telehealth: Payer: Self-pay | Admitting: Gastroenterology

## 2011-05-08 ENCOUNTER — Ambulatory Visit (HOSPITAL_COMMUNITY)
Admit: 2011-05-08 | Discharge: 2011-05-08 | Disposition: A | Discharge status: Home / Self Care | Payer: Medicare Other | Source: Ambulatory Visit | Attending: Gastroenterology | Admitting: Gastroenterology

## 2011-05-08 DIAGNOSIS — K222 Esophageal obstruction: Secondary | ICD-10-CM | POA: Insufficient documentation

## 2011-05-08 DIAGNOSIS — R1310 Dysphagia, unspecified: Secondary | ICD-10-CM | POA: Insufficient documentation

## 2011-05-08 NOTE — Telephone Encounter (Signed)
Referral faxed to [facility] and I called Radiology to have Barium Swallow put on desk- pt is aware

## 2011-05-08 NOTE — Telephone Encounter (Signed)
Please call pt. His swallowing test shows the bottom of his esophagus doesn't open well enough to allow food to pass w/o delay. He needs to seen at [facility] to consider another surgery on his esophagus. HE NEEDS TO GET A COPY OF HIS SWALLOWING TEST ON DISC AND TAKE  IT TO HIS APPT.  REFER TO [facility] ASAP, DR. Lorin Picket, DX: ACHALSIA, DYSPHAGIA, CONSIDER RE-DO HELLER MYOTOMY.  OPV W/ [facility] LATE OCT 2012.

## 2011-05-09 ENCOUNTER — Encounter (HOSPITAL_COMMUNITY): Payer: Medicare Other | Admitting: Psychiatry

## 2011-05-09 ENCOUNTER — Encounter (HOSPITAL_COMMUNITY): Payer: Self-pay | Admitting: Gastroenterology

## 2011-05-10 NOTE — Telephone Encounter (Signed)
Pt has an appt with KJ 06/2011

## 2011-05-16 ENCOUNTER — Ambulatory Visit (INDEPENDENT_AMBULATORY_CARE_PROVIDER_SITE_OTHER): Payer: Medicare Other | Admitting: Psychiatry

## 2011-05-16 DIAGNOSIS — F331 Major depressive disorder, recurrent, moderate: Secondary | ICD-10-CM

## 2011-05-17 ENCOUNTER — Encounter (INDEPENDENT_AMBULATORY_CARE_PROVIDER_SITE_OTHER): Payer: Medicare Other | Admitting: Psychiatry

## 2011-05-17 DIAGNOSIS — F322 Major depressive disorder, single episode, severe without psychotic features: Secondary | ICD-10-CM

## 2011-05-21 LAB — COMPREHENSIVE METABOLIC PANEL
ALT: 19
AST: 25
Albumin: 3.5
Calcium: 9
GFR calc Af Amer: >60
Glucose, Bld: 102 — ABNORMAL HIGH
Sodium: 137
Total Protein: 7.3

## 2011-05-21 LAB — DIFFERENTIAL
Eosinophils Absolute: 0.3
Lymphs Abs: 1.2
Monocytes Absolute: 0.5
Monocytes Relative: 10
Neutrophils Relative %: 55

## 2011-05-21 LAB — URINALYSIS, ROUTINE W REFLEX MICROSCOPIC
Glucose, UA: NEGATIVE
Protein, ur: NEGATIVE

## 2011-05-21 LAB — CBC
MCV: 87.3
Platelets: 204
RDW: 12.9
WBC: 4.4

## 2011-05-21 LAB — URINE MICROSCOPIC-ADD ON

## 2011-05-28 ENCOUNTER — Telehealth: Payer: Self-pay

## 2011-05-28 NOTE — Telephone Encounter (Signed)
Called and informed pts wife

## 2011-05-28 NOTE — Telephone Encounter (Signed)
Please call pt. He should not eat or drink anything 3 hours prior to going to bed. Nothings solid for dinner. He should have boost or creamy soups. He should put his bed on 6 in blocks or sleep in a hospital bed at > 45 degree angle. Please call and speak to Crystal if you haven't heard from [facility] by FRI AM.
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
## 2011-06-03 LAB — DIFFERENTIAL
Basophils Absolute: 0
Basophils Relative: 0
Eosinophils Absolute: 0.2
Eosinophils Absolute: 0.3
Eosinophils Absolute: 0.3
Eosinophils Relative: 4
Eosinophils Relative: 5
Eosinophils Relative: 6 — ABNORMAL HIGH
Lymphocytes Relative: 19
Lymphs Abs: 0.8
Lymphs Abs: 1
Lymphs Abs: 1.2
Monocytes Absolute: 0.3
Monocytes Absolute: 0.4
Monocytes Relative: 7
Monocytes Relative: 9
Neutro Abs: 5.5
Neutrophils Relative %: 80 — ABNORMAL HIGH

## 2011-06-03 LAB — HEPATIC FUNCTION PANEL
ALT: 23
ALT: 29
AST: 23
AST: 29
Albumin: 3.7
Albumin: 3.9
Alkaline Phosphatase: 77
Alkaline Phosphatase: 77
Bilirubin, Direct: 0.1
Indirect Bilirubin: 0.6
Total Bilirubin: 0.7
Total Protein: 7.7
Total Protein: 8

## 2011-06-03 LAB — BASIC METABOLIC PANEL WITH GFR
BUN: 16
CO2: 29
Calcium: 9.2
Chloride: 101
Creatinine, Ser: 0.78
GFR calc Af Amer: >60
GFR calc non Af Amer: >60
Glucose, Bld: 100 — ABNORMAL HIGH
Potassium: 3.6
Sodium: 137

## 2011-06-03 LAB — LIPASE, BLOOD
Lipase: 169 — ABNORMAL HIGH
Lipase: 34

## 2011-06-03 LAB — CBC
HCT: 37 — ABNORMAL LOW
HCT: 41.9
HCT: 46.4
Hemoglobin: 12.4 — ABNORMAL LOW
MCHC: 33.5
MCHC: 34.7
MCV: 85.9
MCV: 86.5
MCV: 87
Platelets: 167
Platelets: 189
RBC: 5.41
RDW: 12.2
RDW: 12.4
WBC: 4.9
WBC: 6.3

## 2011-06-03 LAB — PROTIME-INR
INR: 1
Prothrombin Time: 13.1

## 2011-06-03 LAB — POCT CARDIAC MARKERS
CKMB, poc: <1 — ABNORMAL LOW
Myoglobin, poc: 60.7
Myoglobin, poc: 62.7
Operator id: 286941
Troponin i, poc: <0.05

## 2011-06-03 LAB — COMPREHENSIVE METABOLIC PANEL
ALT: 15
AST: 20
Alkaline Phosphatase: 65
CO2: 25
Chloride: 106
GFR calc Af Amer: >60
GFR calc non Af Amer: >60
Sodium: 138
Total Bilirubin: 1.1

## 2011-06-03 LAB — CARDIAC PANEL(CRET KIN+CKTOT+MB+TROPI)
CK, MB: 0.6
CK, MB: 0.6
CK, MB: 0.7
Relative Index: INVALID
Relative Index: INVALID
Total CK: 33
Total CK: 36
Total CK: 40
Troponin I: 0.02
Troponin I: 0.02
Troponin I: 0.04
Troponin I: 0.04

## 2011-06-03 LAB — BASIC METABOLIC PANEL
BUN: 16
CO2: 30
Chloride: 102
Potassium: 3.7
Sodium: 135

## 2011-06-03 LAB — TSH: TSH: 2.842

## 2011-06-03 LAB — LIPID PANEL
HDL: 27 — ABNORMAL LOW
VLDL: 26

## 2011-06-03 LAB — AMYLASE: Amylase: 160 — ABNORMAL HIGH

## 2011-06-04 ENCOUNTER — Encounter (HOSPITAL_COMMUNITY): Payer: Medicare Other | Admitting: Psychiatry

## 2011-06-04 LAB — URINALYSIS, ROUTINE W REFLEX MICROSCOPIC
Bilirubin Urine: NEGATIVE
Glucose, UA: NEGATIVE
Ketones, ur: NEGATIVE
Nitrite: NEGATIVE
Protein, ur: NEGATIVE
Specific Gravity, Urine: 1.015
Urobilinogen, UA: 0.2
pH: 6.5

## 2011-06-04 LAB — URINE MICROSCOPIC-ADD ON

## 2011-06-06 ENCOUNTER — Encounter (INDEPENDENT_AMBULATORY_CARE_PROVIDER_SITE_OTHER): Payer: Medicare Other | Admitting: Psychiatry

## 2011-06-06 DIAGNOSIS — F331 Major depressive disorder, recurrent, moderate: Secondary | ICD-10-CM

## 2011-06-13 ENCOUNTER — Encounter (INDEPENDENT_AMBULATORY_CARE_PROVIDER_SITE_OTHER): Payer: Medicare Other | Admitting: Psychiatry

## 2011-06-13 DIAGNOSIS — F322 Major depressive disorder, single episode, severe without psychotic features: Secondary | ICD-10-CM

## 2011-06-27 ENCOUNTER — Ambulatory Visit: Payer: Medicare Other | Admitting: Gastroenterology

## 2011-06-27 ENCOUNTER — Telehealth: Payer: Self-pay | Admitting: Gastroenterology

## 2011-06-27 NOTE — Telephone Encounter (Signed)
Pt was a no show

## 2011-06-28 NOTE — Telephone Encounter (Signed)
CALL PT TO [facility]. 

## 2011-07-02 NOTE — Telephone Encounter (Signed)
Pt is aware of OV on 11/8 at 1000 with SF

## 2011-07-03 ENCOUNTER — Ambulatory Visit: Payer: Medicare Other | Admitting: Urgent Care

## 2011-07-04 ENCOUNTER — Ambulatory Visit: Payer: Medicare Other | Admitting: Gastroenterology

## 2011-07-15 ENCOUNTER — Emergency Department (HOSPITAL_COMMUNITY): Payer: Medicare Other

## 2011-07-15 ENCOUNTER — Other Ambulatory Visit: Payer: Self-pay

## 2011-07-15 ENCOUNTER — Emergency Department (HOSPITAL_COMMUNITY)
Admission: EM | Admit: 2011-07-15 | Discharge: 2011-07-15 | Disposition: A | Discharge status: Home / Self Care | Payer: Medicare Other | Attending: Emergency Medicine | Admitting: Emergency Medicine

## 2011-07-15 ENCOUNTER — Encounter (HOSPITAL_COMMUNITY): Payer: Self-pay

## 2011-07-15 DIAGNOSIS — R112 Nausea with vomiting, unspecified: Secondary | ICD-10-CM | POA: Insufficient documentation

## 2011-07-15 DIAGNOSIS — K219 Gastro-esophageal reflux disease without esophagitis: Secondary | ICD-10-CM | POA: Insufficient documentation

## 2011-07-15 DIAGNOSIS — E7800 Pure hypercholesterolemia, unspecified: Secondary | ICD-10-CM | POA: Insufficient documentation

## 2011-07-15 DIAGNOSIS — G8929 Other chronic pain: Secondary | ICD-10-CM | POA: Insufficient documentation

## 2011-07-15 DIAGNOSIS — Z87891 Personal history of nicotine dependence: Secondary | ICD-10-CM | POA: Insufficient documentation

## 2011-07-15 DIAGNOSIS — I498 Other specified cardiac arrhythmias: Secondary | ICD-10-CM | POA: Insufficient documentation

## 2011-07-15 DIAGNOSIS — J4489 Other specified chronic obstructive pulmonary disease: Secondary | ICD-10-CM | POA: Insufficient documentation

## 2011-07-15 DIAGNOSIS — E860 Dehydration: Secondary | ICD-10-CM | POA: Insufficient documentation

## 2011-07-15 DIAGNOSIS — IMO0002 Reserved for concepts with insufficient information to code with codable children: Secondary | ICD-10-CM | POA: Insufficient documentation

## 2011-07-15 DIAGNOSIS — R109 Unspecified abdominal pain: Secondary | ICD-10-CM | POA: Insufficient documentation

## 2011-07-15 DIAGNOSIS — J449 Chronic obstructive pulmonary disease, unspecified: Secondary | ICD-10-CM | POA: Insufficient documentation

## 2011-07-15 LAB — HEPATIC FUNCTION PANEL
Albumin: 4.2 g/dL (ref 3.5–5.2)
Alkaline Phosphatase: 109 U/L (ref 39–117)
Bilirubin, Direct: 0.2 mg/dL (ref 0.0–0.3)
Total Bilirubin: 0.8 mg/dL (ref 0.3–1.2)

## 2011-07-15 LAB — DIFFERENTIAL
Eosinophils Absolute: 0.3 10*3/uL (ref 0.0–0.7)
Eosinophils Relative: 3 % (ref 0–5)
Lymphs Abs: 2.2 10*3/uL (ref 0.7–4.0)
Monocytes Absolute: 0.9 10*3/uL (ref 0.1–1.0)
Monocytes Relative: 8 % (ref 3–12)

## 2011-07-15 LAB — URINALYSIS, ROUTINE W REFLEX MICROSCOPIC
Glucose, UA: NEGATIVE mg/dL
Ketones, ur: 15 mg/dL — AB
Protein, ur: 30 mg/dL — AB

## 2011-07-15 LAB — BASIC METABOLIC PANEL
BUN: 33 mg/dL — ABNORMAL HIGH (ref 6–23)
Calcium: 10.3 mg/dL (ref 8.4–10.5)
Creatinine, Ser: 1.5 mg/dL — ABNORMAL HIGH (ref 0.50–1.35)
GFR calc non Af Amer: 50 mL/min — ABNORMAL LOW (ref >90)
Glucose, Bld: 119 mg/dL — ABNORMAL HIGH (ref 70–99)
Potassium: 3.1 mEq/L — ABNORMAL LOW (ref 3.5–5.1)

## 2011-07-15 LAB — CBC
HCT: 50.3 % (ref 39.0–52.0)
Hemoglobin: 16.6 g/dL (ref 13.0–17.0)
MCH: 29.8 pg (ref 26.0–34.0)
MCHC: 33 g/dL (ref 30.0–36.0)
MCV: 90.3 fL (ref 78.0–100.0)
RBC: 5.57 MIL/uL (ref 4.22–5.81)

## 2011-07-15 LAB — URINE MICROSCOPIC-ADD ON

## 2011-07-15 MED ORDER — ONDANSETRON HCL 4 MG/2ML IJ SOLN
4.0000 mg | INTRAMUSCULAR | Status: AC | PRN
  Administered 2011-07-15 (×2): 4 mg via INTRAVENOUS
  Filled 2011-07-15 (×2): qty 2

## 2011-07-15 MED ORDER — FAMOTIDINE IN NACL 20-0.9 MG/50ML-% IV SOLN
20.0000 mg | Freq: Once | INTRAVENOUS | Status: AC
Start: 2011-07-15 — End: 2011-07-15
  Administered 2011-07-15: 20 mg via INTRAVENOUS
  Filled 2011-07-15: qty 50

## 2011-07-15 MED ORDER — SODIUM CHLORIDE 0.9 % IV BOLUS (SEPSIS)
500.0000 mL | Freq: Once | INTRAVENOUS | Status: AC
  Administered 2011-07-15: 500 mL via INTRAVENOUS

## 2011-07-15 MED ORDER — SODIUM CHLORIDE 0.9 % IV BOLUS (SEPSIS)
500.0000 mL | Freq: Once | INTRAVENOUS | Status: AC
  Administered 2011-07-15: 1000 mL via INTRAVENOUS

## 2011-07-15 MED ORDER — ONDANSETRON HCL 4 MG PO TABS: 4.0000 mg | ORAL_TABLET | Freq: Four times a day (QID) | ORAL | Status: AC | PRN

## 2011-07-15 MED ORDER — MORPHINE SULFATE 2 MG/ML IJ SOLN
2.0000 mg | INTRAMUSCULAR | Status: AC | PRN
  Administered 2011-07-15 (×2): 2 mg via INTRAVENOUS
  Filled 2011-07-15 (×2): qty 1

## 2011-07-15 MED ORDER — SODIUM CHLORIDE 0.9 % IV SOLN
INTRAVENOUS | Status: DC
  Administered 2011-07-15: 13:00:00 via INTRAVENOUS

## 2011-07-15 NOTE — ED Notes (Signed)
Pt requesting more pain medication. Will medicate per [facility]

## 2011-07-15 NOTE — ED Notes (Signed)
Pt tolerated cup of water at this time

## 2011-07-15 NOTE — ED Notes (Signed)
I Stat Troponin resulted- 0.02

## 2011-07-15 NOTE — ED Notes (Signed)
Per edp, given ice water - approx 2 min after sipping ice water pt began actively vomiting.  edp notified and anti nausea meds given.  Pt transported to x-ray at this time.

## 2011-07-15 NOTE — ED Notes (Signed)
C/o cp/epigastric pain after having his esophagus dilated. C/o "aching" pain with nausea as well. Denies any radiation.

## 2011-07-15 NOTE — ED Provider Notes (Signed)
History    Scribed for Walter Anger, DO, the patient was seen in room APA14/APA14. This chart was scribed by Katha Cabal.   CSN: [id number] Arrival date & time: 07/15/2011  9:12 AM  Chief Complaint  Patient presents with  . Chest Pain    HPI Pt was seen at 9:54 AM.  Walter Herring is a 55 y.o. male with history of GERD presents to the Emergency Department complaining of gradual onset and persistence of constant mid epigastric abd "pain" that began after patient had his esophagus dilated on 07/04/11 by Dr. Merri Brunette at [facility].  States he has not called his GI doctor for this.  This is a ongoing problem for many years.   Symptoms are associated with nausea/vomiting and negative for dark stools.  Denies diarrhea, no black or blood in stools or emesis, no fevers, no back pain, no CP/SOB, no cough.    PCP Isabella Stalling, MD GI:  Dr. Loreta Ave, and Dr. Merri Brunette at [facility]  Past Medical History  Diagnosis Date  . DDD (degenerative disc disease)   . GERD (gastroesophageal reflux disease)   . COPD (chronic obstructive pulmonary disease)   . Hypercholesterolemia   . Anxiety   . Achalasia 2008 168 LBS  . Chronic abdominal pain     AUG 2010 CT ABD W/ IVC-NL PANCREAS, GB, LIVER  . Hypercholesteremia   . Dysphagia     Past Surgical History  Procedure Date  . 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 CARL WESTCOTT  . Esophagogastroduodenoscopy      MAR 2012 (RMR) TTS DIL 20 MM, JAN 2010 Gastritis  . Hemorrhoid surgery   . Colonoscopy 2009 [facility]    Normal  . Savory dilation 05/03/2011    Procedure: SAVORY DILATION;  Surgeon: Arlyce Harman, MD;  Location: [facility];  Service: Endoscopy;  Laterality: N/A;  dilated to 17mm    Family History  Problem Relation Age of Onset  . Colon cancer Neg Hx   . Colon polyps Neg Hx   . Anesthesia problems Neg Hx   . Hypotension Neg Hx   . Malignant hyperthermia Neg Hx   . Pseudochol deficiency Neg Hx     History  Substance Use Topics  . Smoking status: Former  Smoker    Quit date: 08/27/1995  . Smokeless tobacco: Current User    Types: Chew  . Alcohol Use: No      Allergies    Allergies  Allergen Reactions  . Dairy Aid (Lactase) Nausea And Vomiting     Home Medications    Current Outpatient Rx  Name Route Sig Dispense Refill  . AMITRIPTYLINE HCL 25 MG PO TABS Oral Take 25 mg by mouth at bedtime.      Marland Kitchen DIPHENHYDRAMINE HCL 25 MG PO TABS Oral Take 50 mg by mouth at bedtime as needed. Sleep aid     . DULOXETINE HCL 60 MG PO CPEP Oral Take 60 mg by mouth daily.      Marland Kitchen HYDROCODONE-ACETAMINOPHEN 7.5-325 MG PO TABS Oral Take 1 tablet by mouth every 8 (eight) hours as needed. For pain    . LORAZEPAM 1 MG PO TABS Oral Take 1 mg by mouth 2 (two) times daily.      . MULTIVITAMINS PO TABS Oral Take 1 tablet by mouth daily.      Marland Kitchen OMEPRAZOLE 20 MG PO CPDR Oral Take 20 mg by mouth daily. Patient takes in the evenings     . PRAVASTATIN SODIUM 40 MG PO TABS  Oral Take 40 mg by mouth daily.     Marland Kitchen PROMETHAZINE HCL 25 MG PO TABS  1-2 PO Q4-6H PRN NAUSEA AND VOMITING 30 tablet 5  . QUETIAPINE FUMARATE 200 MG PO TB24 Oral Take 200 mg by mouth at bedtime.      . OMEPRAZOLE 20 MG PO CPDR  20 mg daily. 1 PO 30 MINUTES PRIOR TO MEALS twice daily          Review of Systems ROS: Statement: All systems negative except as marked or noted in the HPI; Constitutional: Negative for fever and chills. ; ; Eyes: Negative for eye pain, redness and discharge. ; ; ENMT: Negative for ear pain, hoarseness, nasal congestion, sinus pressure and sore throat. ; ; Cardiovascular: Negative for chest pain, palpitations, diaphoresis, dyspnea and peripheral edema. ; ; Respiratory: Negative for cough, wheezing and stridor. ; ; Gastrointestinal: Positive for nausea, vomiting, and abdominal pain; negative for diarrhea, blood in stool, hematemesis, jaundice and rectal bleeding. . ; ; Genitourinary: Negative for dysuria, flank pain and hematuria. ; ; Musculoskeletal: Negative for back pain  and neck pain. Negative for swelling and trauma.; ; Skin: Negative for pruritus, rash, abrasions, blisters, bruising and skin lesion.; ; Neuro: Negative for headache, lightheadedness and neck stiffness. Negative for weakness, altered level of consciousness , altered mental status, extremity weakness, paresthesias, involuntary movement, seizure and syncope. Psych:  No SI, no SA, no HI, no hallucinations, +anxiety.     BP 107/87  Pulse 106  Temp(Src) 98.2 F (36.8 C) (Oral)  Resp 20  Ht 6\' 1"  (1.854 m)  Wt 200 lb (90.719 kg)  BMI 26.39 kg/m2  SpO2 97%  Physical Exam 1000: Physical examination:  Nursing notes reviewed; Vital signs and O2 SAT reviewed;  Constitutional: Well developed, Well nourished, In no acute distress; Head:  Normocephalic, atraumatic; Eyes: EOMI, PERRL, No scleral icterus; ENMT: Mouth and pharynx normal, Mucous membranes dry; Neck: Supple, Full range of motion, No lymphadenopathy; Cardiovascular: Tachycardic, No murmur or gallop; Respiratory: Breath sounds clear & equal bilaterally, No rales, rhonchi, wheezes, or rub, Normal respiratory effort/excursion; Chest: Nontender, Movement normal; Abdomen: Soft, +tender mid-epigastric area to palp, no rebound or guarding. Nondistended, Normal bowel sounds; Rectal exam performed w/permission of pt and ED RN chaparone present.  Anal tone normal.  Non-tender, soft yellow stool in rectal vault, heme neg.  No fissures, no external hemorrhoids, no palp masses. Extremities: Pulses normal, No tenderness, No edema, No calf edema or asymmetry.; Neuro: AA&Ox3, Major CN grossly intact.  No gross focal motor or sensory deficits in extremities.; Skin: Color normal, Warm, Dry, no rash. Psych:  +anxious.  ED Course  Procedures   MDM  MDM Reviewed: nursing note and vitals Reviewed previous: ECG Interpretation: ECG, labs and x-ray    Date: 07/15/2011  Rate: 119  Rhythm: sinus tachycardia  QRS Axis: normal  Intervals: normal  ST/T Wave  abnormalities: nonspecific ST/T changes  Conduction Disutrbances:none  Narrative Interpretation:   Old EKG Reviewed: unchanged, no significant changes from previous EKG dated 04/26/2011.  Results for orders placed during the hospital encounter of 07/15/11  CBC      Component Value Range   WBC 10.7 (*) 4.0 - 10.5 (K/uL)   RBC 5.57  4.22 - 5.81 (MIL/uL)   Hemoglobin 16.6  13.0 - 17.0 (g/dL)   HCT 16.1  09.6 - 04.5 (%)   MCV 90.3  78.0 - 100.0 (fL)   MCH 29.8  26.0 - 34.0 (pg)   MCHC 33.0  30.0 -  36.0 (g/dL)   RDW 62.1  30.8 - 65.7 (%)   Platelets 264  150 - 400 (K/uL)  DIFFERENTIAL      Component Value Range   Neutrophils Relative 68  43 - 77 (%)   Neutro Abs 7.3  1.7 - 7.7 (K/uL)   Lymphocytes Relative 21  12 - 46 (%)   Lymphs Abs 2.2  0.7 - 4.0 (K/uL)   Monocytes Relative 8  3 - 12 (%)   Monocytes Absolute 0.9  0.1 - 1.0 (K/uL)   Eosinophils Relative 3  0 - 5 (%)   Eosinophils Absolute 0.3  0.0 - 0.7 (K/uL)   Basophils Relative 0  0 - 1 (%)   Basophils Absolute 0.0  0.0 - 0.1 (K/uL)  BASIC METABOLIC PANEL      Component Value Range   Sodium 138  135 - 145 (mEq/L)   Potassium 3.1 (*) 3.5 - 5.1 (mEq/L)   Chloride 94 (*) 96 - 112 (mEq/L)   CO2 31  19 - 32 (mEq/L)   Glucose, Bld 119 (*) 70 - 99 (mg/dL)   BUN 33 (*) 6 - 23 (mg/dL)   Creatinine, Ser 8.46 (*) 0.50 - 1.35 (mg/dL)   Calcium 96.2  8.4 - 10.5 (mg/dL)   GFR calc non Af Amer 50 (*) >90 (mL/min)   GFR calc Af Amer 58 (*) >90 (mL/min)  LACTIC ACID, PLASMA      Component Value Range   Lactic Acid, Venous 1.4  0.5 - 2.2 (mmol/L)  HEPATIC FUNCTION PANEL      Component Value Range   Total Protein 8.8 (*) 6.0 - 8.3 (g/dL)   Albumin 4.2  3.5 - 5.2 (g/dL)   AST 24  0 - 37 (U/L)   ALT 21  0 - 53 (U/L)   Alkaline Phosphatase 109  39 - 117 (U/L)   Total Bilirubin 0.8  0.3 - 1.2 (mg/dL)   Bilirubin, Direct 0.2  0.0 - 0.3 (mg/dL)   Indirect Bilirubin 0.6  0.3 - 0.9 (mg/dL)  URINALYSIS, ROUTINE W REFLEX MICROSCOPIC       Component Value Range   Color, Urine YELLOW  YELLOW    Appearance CLEAR  CLEAR    Specific Gravity, Urine 1.030  1.005 - 1.030    pH 6.0  5.0 - 8.0    Glucose, UA NEGATIVE  NEGATIVE (mg/dL)   Hgb urine dipstick NEGATIVE  NEGATIVE    Bilirubin Urine LARGE (*) NEGATIVE    Ketones, ur 15 (*) NEGATIVE (mg/dL)   Protein, ur 30 (*) NEGATIVE (mg/dL)   Urobilinogen, UA 1.0  0.0 - 1.0 (mg/dL)   Nitrite NEGATIVE  NEGATIVE    Leukocytes, UA TRACE (*) NEGATIVE   URINE MICROSCOPIC-ADD ON      Component Value Range   Squamous Epithelial / LPF FEW (*) RARE    WBC, UA 3-6  <3 (WBC/hpf)   Bacteria, UA FEW (*) RARE    Dg Chest Portable 1 View  07/15/2011  *RADIOLOGY REPORT*  Clinical Data: Chest pain.  COPD.  PORTABLE CHEST - 1 VIEW  Comparison: 02/08/2011  Findings: Heart size and vascularity are normal and the lungs are clear.  No osseous abnormality.  IMPRESSION: Normal chest.  Original Report Authenticated By: Gwynn Burly, M.D.   12:21 PM:  SBP increased to 110's after IVF bolus.  No free air or pneumomediatinum on CXR.  UA contaminated, UC pending.  H/H stable. Cr mildly elevated.  Pt unable to tol PO fluids: took  2 sips, stated he was "OK," and when ED RN left the exam room he vomited the water.  Will dose zofran IV and check AXR r/o obstruction.    12:34 PM:  T/C to Dr. [name], case discussed, including:  HPI, pertinent PM/SHx, VS/PE, dx testing, ED course and treatment: States he knows pt well, BP usually 100/110's, pt has significant hx of being very anxious, he just saw pt in the office last week and pt did not mention these symptoms, pt told PMD that he was "swallowing ok."  Req to obtain AXR and call him back. Dx testing, as well as d/w PMD, d/w pt and family. Questions answered.  Verb understanding, agreeable plan.   Dg Abd 2 Views  07/15/2011  *RADIOLOGY REPORT*  Clinical Data: Abdominal pain with nausea and vomiting.  Question small bowel obstruction.  ABDOMEN - 2 VIEW  Comparison:  Lumbar spine CT 11/21/2010.  Findings: The bowel gas pattern is normal.  There is no evidence of free intraperitoneal air.  Left pelvic phleboliths appear unchanged.  There is mild convex right lumbar scoliosis.  IMPRESSION: No acute abdominal findings.  Original Report Authenticated By: Gerrianne Scale, M.D.     2:35 PM:  Pt able to drink a glass of water, no N/V.  Wants his IV taken out and wants to go home now.  Pt's significant other up to nurses station multiple times requesting discharge.  T/C to Dr. [name], case discussed, including:  HPI, pertinent PM/SHx, VS/PE, dx testing, ED course and treatment.  Agreeable to f/u pt in ofc tomorrow morning.  Pt and family agreeable with plan.     [facility] M I personally performed the services described in this documentation, which was scribed in my presence. The recorded information has been reviewed and considered.       Walter Anger, DO 07/15/11 1942

## 2011-07-16 ENCOUNTER — Encounter (HOSPITAL_COMMUNITY): Payer: Medicare Other | Admitting: Psychiatry

## 2011-07-16 ENCOUNTER — Ambulatory Visit (HOSPITAL_COMMUNITY): Payer: Medicare Other | Admitting: Psychiatry

## 2011-07-16 LAB — URINE CULTURE: Colony Count: 45000

## 2011-07-17 ENCOUNTER — Ambulatory Visit: Payer: Medicare Other | Admitting: Gastroenterology

## 2011-07-17 ENCOUNTER — Telehealth: Payer: Self-pay | Admitting: Gastroenterology

## 2011-07-17 NOTE — Telephone Encounter (Signed)
Pt was a no show

## 2011-07-17 NOTE — Telephone Encounter (Signed)
NSX2. CALLED CELL-NO VM. CALLED HOME-LVM CALL ME WITH AN UPDATE.

## 2011-07-24 ENCOUNTER — Ambulatory Visit (INDEPENDENT_AMBULATORY_CARE_PROVIDER_SITE_OTHER): Payer: Medicare Other | Admitting: Gastroenterology

## 2011-07-24 ENCOUNTER — Ambulatory Visit (HOSPITAL_COMMUNITY)
Admission: RE | Admit: 2011-07-24 | Discharge: 2011-07-24 | Disposition: A | Discharge status: Home / Self Care | Payer: Medicare Other | Source: Ambulatory Visit | Attending: Gastroenterology | Admitting: Gastroenterology

## 2011-07-24 ENCOUNTER — Encounter: Payer: Self-pay | Admitting: Gastroenterology

## 2011-07-24 DIAGNOSIS — R1310 Dysphagia, unspecified: Secondary | ICD-10-CM | POA: Insufficient documentation

## 2011-07-24 NOTE — Progress Notes (Signed)
Cc to PCP 

## 2011-07-24 NOTE — Progress Notes (Signed)
Subjective:    Patient ID: Walter Herring, male    DOB: Jun 21, 1956, 55 y.o.   MRN: [id number]  PCP: DONDIEGO  HPI  2012: MAR-EGD/DIL TTS 20 MM. SEEN IN AUG C/O DYSPHAGIA. May 03 2011 EGD/DIL SAV 17 MM. BPE SEP 12  Dilated thoracic esophagus with significant diffuse impairment of esophageal motility consistent with history of achalasia. Recurrent stricture at gastroesophageal junction, obstructing a 12.5 mm barium tablet. NO 8 EGD/DIL TTS 20 MM AND BOTOX INJECTION AT [facility]. [facility])  Gained 18 lbs reportedly and now 10 lbs over last weight in AUG 2012. VITALS UNCHANGED SINCE LAST VISIT AUG 2012. Sx did not get better AFTE EGD/DIL NOV 2012. Able to tolerate nothing. If he eats or drinks then it comes right back up after 5 minutes.  APPT W/ DR. FINA DEC 11. SEEN IN ED LAST WEEK. TRYING TO DRINK ENSURE AS WELL BUT IT "WON'T STAY DOWN".  Past Medical History  Diagnosis Date  . DDD (degenerative disc disease)   . GERD (gastroesophageal reflux disease)   . COPD (chronic obstructive pulmonary disease)   . Hypercholesterolemia   . Anxiety   . Achalasia 2008 168 LBS  . Chronic abdominal pain     AUG 2010 CT ABD W/ IVC-NL PANCREAS, GB, LIVER  . Hypercholesteremia   . Dysphagia     Past Surgical History  Procedure Date  . 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 CARL WESTCOTT  . Esophagogastroduodenoscopy      MAR 2012 (RMR) TTS DIL 20 MM, JAN 2010 Gastritis  . Hemorrhoid surgery   . Colonoscopy 2009 [facility]    Normal  . Savory dilation 05/03/2011    Procedure: SAVORY DILATION;  Surgeon: Arlyce Harman, MD;  Location: [facility];  Service: Endoscopy;  Laterality: N/A;  dilated to 17mm   Allergies  Allergen Reactions  . Dairy Aid (Lactase) Nausea And Vomiting    Current Outpatient Prescriptions  Medication Sig Dispense Refill  . amitriptyline (ELAVIL) 25 MG tablet Take 25 mg by mouth at bedtime.        . diphenhydrAMINE (BENADRYL) 25 MG tablet Take 50 mg by mouth at bedtime as needed. Sleep aid       .  DULoxetine (CYMBALTA) 60 MG capsule Take 60 mg by mouth daily.        Marland Kitchen HYDROcodone-acetaminophen (NORCO) 7.5-325 MG per tablet Take 1 tablet by mouth every 8 (eight) hours as needed. For pain      . LORazepam (ATIVAN) 1 MG tablet Take 1 mg by mouth 2 (two) times daily.        . multivitamin (THERAGRAN) per tablet Take 1 tablet by mouth daily.        Marland Kitchen omeprazole (PRILOSEC) 20 MG capsule Take 20 mg by mouth daily. Patient takes in the evenings       . pravastatin (PRAVACHOL) 40 MG tablet Take 40 mg by mouth daily.       . promethazine (PHENERGAN) 25 MG tablet 1-2 PO Q4-6H PRN NAUSEA AND VOMITING  30 tablet  5  . QUEtiapine (SEROQUEL XR) 200 MG 24 hr tablet Take 200 mg by mouth at bedtime.             Review of Systems  All other systems reviewed and are negative.       Objective:   Physical Exam  Vitals reviewed. Constitutional: He is oriented to person, place, and time. He appears well-developed and well-nourished.  HENT:  Head: Normocephalic and atraumatic.  Mouth/Throat: Oropharynx is  clear and moist. No oropharyngeal exudate.  Eyes: Pupils are equal, round, and reactive to light. No scleral icterus.  Neck: Normal range of motion. Neck supple.  Cardiovascular: Normal rate, regular rhythm and normal heart sounds.   Pulmonary/Chest: Effort normal and breath sounds normal. No respiratory distress.  Abdominal: Soft. Bowel sounds are normal. He exhibits no distension. There is tenderness (MILD TTP IN RU/RLQ). There is no rebound and no guarding.  Musculoskeletal: He exhibits no edema.  Neurological: He is alert and oriented to person, place, and time.       NO FOCAL DEFICITS   Skin: Skin is warm and dry.  Psychiatric:       DEPRESSED MOOD FLAT AFFECT          Assessment & Plan:

## 2011-07-24 NOTE — Patient Instructions (Signed)
I spoke with Dr. Merri Brunette. He agrees you need a repeat Barium study. Continue a full liquid diet as tolerated. I will call Dr. Merri Brunette when we have the results of the Barium study and then call you with the next step.

## 2011-07-24 NOTE — Progress Notes (Signed)
Addendum note faxed to Dr Lorin Picket

## 2011-07-24 NOTE — Progress Notes (Signed)
Mr Davids is scheduled to see Dr Lorin Picket on 08/28/11 @ 10:15

## 2011-07-24 NOTE — Progress Notes (Signed)
Patient ID: ASAR EVILSIZER, male   DOB: 21-Nov-1955, 55 y.o.   MRN: [id number] Spoke with Dr. Toniann Fail food impaction, atonic esophagus, narrow GE jxn that does not allow the 12.5 mm tablet to pass. Pt vomited during study so no supine mages were obtained.   Spoke to Dr. Merri Brunette. Agrees no more endoscopic options. Spoke with Dr. Lorin Picket. Will get pt in to be seen ASAP. Spoke to his wife, [phone]). Explained the plan. PT SHOULD DRINK 4 CANS OF BOOST OR ENSURE OVER 1 HOUR. She is to call me with questions.

## 2011-07-24 NOTE — Assessment & Plan Note (Signed)
MOST LIKELY 2O to achalasia and narrowed GEjxn due to post surgical changes/fibrosis?. Pt has failed to respond to EGD/DIL x3 in 2012.  Discussed case with Dr. Merri Brunette. Agrees pt needs BPE. Will arrange today. Will call pt with results. If food impaction, EGD today WITH PROPOFOL DUE TO POLYPHARMACY. Will call Dr. Merri Brunette with results. OPV in 3 mos. PT NEEDS AN APPT WITH DR. Lorin Picket TO REASSESS NEED FOR REPEAT/EXTENSION OF HIS HELLER MYOTOMY.

## 2011-07-25 ENCOUNTER — Telehealth: Payer: Self-pay

## 2011-07-25 NOTE — Progress Notes (Signed)
Pt and wife are aware of appt on 12/05/ @ 11:30 w/ Dr Lorin Picket

## 2011-07-25 NOTE — Telephone Encounter (Signed)
Pt's wife, Aram Beecham, called and said she was supposed to let Dr. Darrick Penna know if Dr. Lorin Picket had not called by 12:30 PM today. She has not heard from him.

## 2011-07-25 NOTE — Progress Notes (Signed)
PLEASE CALL PT. HE HAS AN APPT WITH DR. Lorin Picket 12/5 1130. PLEASE SHOW UP 15 MINS PRIOR TO APPT TIME & BRING XRAY STUDIES PLACED ON THE DISC.

## 2011-07-29 NOTE — Progress Notes (Signed)
Reminder in epic to follow up in 3 months °

## 2011-07-31 NOTE — Telephone Encounter (Signed)
Pt had appt at [facility] this week.

## 2011-08-10 ENCOUNTER — Other Ambulatory Visit (HOSPITAL_COMMUNITY): Payer: Self-pay | Admitting: Psychiatry

## 2011-08-12 ENCOUNTER — Other Ambulatory Visit (HOSPITAL_COMMUNITY): Payer: Self-pay | Admitting: Psychiatry

## 2011-08-12 ENCOUNTER — Telehealth: Payer: Self-pay | Admitting: Gastroenterology

## 2011-08-12 NOTE — Telephone Encounter (Signed)
Spoke to pt's wife. She is very upset about. Pt has not had any surgery. He was dehydrated when he got there and they started IV's. They took them out, and are talking about discharge tomorrow. She is very worried and would like for Dr. Darrick Penna to talk to Dr. Carolynn Sayers and let her know what he says.

## 2011-08-12 NOTE — Telephone Encounter (Signed)
Pt's wife Aram Beecham) called to get Dr Aggie Hacker number at [facility]. Pt has been there since 07/31/11. Wife is worried that husband is not getting better due to he is still vomiting and has no IV fluid hooked up, he is losing weight and has fallen in the shower while at hospital. She has not spoke to Dr Lorin Picket and was told they maybe discharging patient tomorrow. Wife is telling me that nothing has been done and she was hoping that SF could speak to Dr Lorin Picket and find out what the plan of care will be for patient. Please call her back at [phone] to give her an update on what will be done for her husband. She is very anxious and scared.

## 2011-08-13 ENCOUNTER — Telehealth: Payer: Self-pay

## 2011-08-13 NOTE — Telephone Encounter (Signed)
Pt's wife called yesterday concerned about him. I tried to call her back today. (Please see note dated 08/12/2011) looks like it was closed.

## 2011-08-13 NOTE — Telephone Encounter (Signed)
Called home number to check with Aram Beecham about pt. LMOM to call. Called mobile number, VM not set up.

## 2011-08-13 NOTE — Telephone Encounter (Signed)
Called [facility] line-on hold for 10 minutes & no one picked up. Called his office & Dr. Lorin Picket was in surgery until 530 PM. Will page him 12/19.

## 2011-08-14 NOTE — Telephone Encounter (Signed)
PT CAME HOME YESTERDAY WITH DIET MODIFICATION AND  TO CRUSH HIS PILLS. HIS ESOPHAGUS IS INFLAMMED. Feels better since he's home. DR. Lorin Picket DID SEE HIM PRIOR TO DISCHARGE. SEES HIM IN FOLLOW UP JAN 9. WILL DISCUSS CARE WITH DR. Lorin Picket. Contacted [facility] LINE ON HOLD FOR 10 MINS. NO ANSWER.

## 2011-08-15 NOTE — Telephone Encounter (Signed)
Pt is home now. Spoke to pt's wife. She said he is eating a little now. Taking some liquid medication to help erosions heal.

## 2011-08-21 ENCOUNTER — Telehealth (HOSPITAL_COMMUNITY): Payer: Self-pay | Admitting: *Deleted

## 2011-08-21 NOTE — Telephone Encounter (Signed)
PHARMACY REQUEST ELAVIL 75 MG. TABLET; TAKE ONE TABLET BY MOUTH EVERY DAY AT BEDTIME.

## 2011-08-22 ENCOUNTER — Other Ambulatory Visit (HOSPITAL_COMMUNITY): Payer: Self-pay | Admitting: *Deleted

## 2011-08-22 DIAGNOSIS — F331 Major depressive disorder, recurrent, moderate: Secondary | ICD-10-CM

## 2011-08-22 MED ORDER — AMITRIPTYLINE HCL 75 MG PO TABS: 75.0000 mg | ORAL_TABLET | Freq: Every day | ORAL | Status: DC

## 2011-08-24 ENCOUNTER — Emergency Department (HOSPITAL_COMMUNITY)
Admission: EM | Admit: 2011-08-24 | Discharge: 2011-08-24 | Disposition: A | Discharge status: Home / Self Care | Payer: Medicare Other | Attending: Emergency Medicine | Admitting: Emergency Medicine

## 2011-08-24 ENCOUNTER — Other Ambulatory Visit: Payer: Self-pay

## 2011-08-24 ENCOUNTER — Encounter (HOSPITAL_COMMUNITY): Payer: Self-pay

## 2011-08-24 ENCOUNTER — Emergency Department (HOSPITAL_COMMUNITY): Payer: Medicare Other

## 2011-08-24 DIAGNOSIS — R509 Fever, unspecified: Secondary | ICD-10-CM | POA: Insufficient documentation

## 2011-08-24 DIAGNOSIS — E7800 Pure hypercholesterolemia, unspecified: Secondary | ICD-10-CM | POA: Insufficient documentation

## 2011-08-24 DIAGNOSIS — R51 Headache: Secondary | ICD-10-CM | POA: Insufficient documentation

## 2011-08-24 DIAGNOSIS — R0609 Other forms of dyspnea: Secondary | ICD-10-CM | POA: Insufficient documentation

## 2011-08-24 DIAGNOSIS — J449 Chronic obstructive pulmonary disease, unspecified: Secondary | ICD-10-CM | POA: Insufficient documentation

## 2011-08-24 DIAGNOSIS — R197 Diarrhea, unspecified: Secondary | ICD-10-CM | POA: Insufficient documentation

## 2011-08-24 DIAGNOSIS — R112 Nausea with vomiting, unspecified: Secondary | ICD-10-CM | POA: Insufficient documentation

## 2011-08-24 DIAGNOSIS — I498 Other specified cardiac arrhythmias: Secondary | ICD-10-CM | POA: Insufficient documentation

## 2011-08-24 DIAGNOSIS — E876 Hypokalemia: Secondary | ICD-10-CM | POA: Insufficient documentation

## 2011-08-24 DIAGNOSIS — R0989 Other specified symptoms and signs involving the circulatory and respiratory systems: Secondary | ICD-10-CM | POA: Insufficient documentation

## 2011-08-24 DIAGNOSIS — G8929 Other chronic pain: Secondary | ICD-10-CM | POA: Insufficient documentation

## 2011-08-24 DIAGNOSIS — R6883 Chills (without fever): Secondary | ICD-10-CM | POA: Insufficient documentation

## 2011-08-24 DIAGNOSIS — IMO0002 Reserved for concepts with insufficient information to code with codable children: Secondary | ICD-10-CM | POA: Insufficient documentation

## 2011-08-24 DIAGNOSIS — K219 Gastro-esophageal reflux disease without esophagitis: Secondary | ICD-10-CM | POA: Insufficient documentation

## 2011-08-24 DIAGNOSIS — J4489 Other specified chronic obstructive pulmonary disease: Secondary | ICD-10-CM | POA: Insufficient documentation

## 2011-08-24 DIAGNOSIS — Z87891 Personal history of nicotine dependence: Secondary | ICD-10-CM | POA: Insufficient documentation

## 2011-08-24 DIAGNOSIS — R109 Unspecified abdominal pain: Secondary | ICD-10-CM | POA: Insufficient documentation

## 2011-08-24 LAB — CBC
HCT: 46.5 % (ref 39.0–52.0)
Hemoglobin: 15.6 g/dL (ref 13.0–17.0)
MCH: 29.3 pg (ref 26.0–34.0)
MCV: 87.4 fL (ref 78.0–100.0)
RBC: 5.32 MIL/uL (ref 4.22–5.81)

## 2011-08-24 LAB — DIFFERENTIAL
Eosinophils Absolute: 0.1 10*3/uL (ref 0.0–0.7)
Eosinophils Relative: 1 % (ref 0–5)
Lymphs Abs: 0.7 10*3/uL (ref 0.7–4.0)
Monocytes Absolute: 0.5 10*3/uL (ref 0.1–1.0)
Monocytes Relative: 5 % (ref 3–12)
Neutrophils Relative %: 87 % — ABNORMAL HIGH (ref 43–77)

## 2011-08-24 LAB — COMPREHENSIVE METABOLIC PANEL
Alkaline Phosphatase: 92 U/L (ref 39–117)
BUN: 21 mg/dL (ref 6–23)
Creatinine, Ser: 1.47 mg/dL — ABNORMAL HIGH (ref 0.50–1.35)
GFR calc Af Amer: 59 mL/min — ABNORMAL LOW (ref >90)
Glucose, Bld: 124 mg/dL — ABNORMAL HIGH (ref 70–99)
Potassium: 3.1 mEq/L — ABNORMAL LOW (ref 3.5–5.1)
Total Protein: 8.4 g/dL — ABNORMAL HIGH (ref 6.0–8.3)

## 2011-08-24 LAB — LIPASE, BLOOD: Lipase: 22 U/L (ref 11–59)

## 2011-08-24 MED ORDER — HYDROMORPHONE HCL PF 1 MG/ML IJ SOLN
1.0000 mg | Freq: Once | INTRAMUSCULAR | Status: AC
  Administered 2011-08-24: 1 mg via INTRAVENOUS
  Filled 2011-08-24: qty 1

## 2011-08-24 MED ORDER — ONDANSETRON HCL 4 MG/2ML IJ SOLN
4.0000 mg | Freq: Once | INTRAMUSCULAR | Status: AC
  Administered 2011-08-24: 4 mg via INTRAVENOUS
  Filled 2011-08-24: qty 2

## 2011-08-24 MED ORDER — SODIUM CHLORIDE 0.9 % IV SOLN
INTRAVENOUS | Status: DC
  Administered 2011-08-24: 16:00:00 via INTRAVENOUS

## 2011-08-24 MED ORDER — ONDANSETRON 8 MG PO TBDP: 8.0000 mg | ORAL_TABLET | Freq: Three times a day (TID) | ORAL | Status: AC | PRN

## 2011-08-24 MED ORDER — SODIUM CHLORIDE 0.9 % IV BOLUS (SEPSIS)
500.0000 mL | Freq: Once | INTRAVENOUS | Status: AC
  Administered 2011-08-24: 500 mL via INTRAVENOUS

## 2011-08-24 NOTE — ED Provider Notes (Signed)
This chart was scribed for [employer]. Rubin Payor, MD by Wallis Mart. The patient was seen in room APA04/APA04 and the patient's care was started at 3:20 PM.   CSN: [id number]  Arrival date & time 08/24/11  1438   First MD Initiated Contact with Patient 08/24/11 1509      Chief Complaint  Patient presents with  . Emesis  . Nausea  . Diarrhea  . Chills  . Abdominal Pain    (Consider location/radiation/quality/duration/timing/severity/associated sxs/prior treatment) HPI Pt seen at 3:20 PM  Walter Herring is a 55 y.o. male who presents to the Emergency Department complaining of gradual  onset, persistence of constant moderate to severe n/v/d that began last night.  Pt c/o associated chills, fever, and abdominal pain, headache, trouble breathing.  Pt denies blood in vomit or diarrhea .    Pt denies cough.  Pt w/ h/o pharyngeal erythema and extensive esophageal problems.    PCP: Delbert Harness  Past Medical History  Diagnosis Date  . DDD (degenerative disc disease)   . GERD (gastroesophageal reflux disease)   . COPD (chronic obstructive pulmonary disease)   . Hypercholesterolemia   . Anxiety   . Achalasia 2008 168 LBS  . Chronic abdominal pain     AUG 2010 CT ABD W/ IVC-NL PANCREAS, GB, LIVER  . Hypercholesteremia   . Dysphagia     Past Surgical History  Procedure Date  . 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 CARL WESTCOTT  . Esophagogastroduodenoscopy      MAR 2012 (RMR) TTS DIL 20 MM, JAN 2010 Gastritis  . Hemorrhoid surgery   . Colonoscopy 2009 [facility]    Normal  . Savory dilation 05/03/2011    Procedure: SAVORY DILATION;  Surgeon: Arlyce Harman, MD;  Location: [facility];  Service: Endoscopy;  Laterality: N/A;  dilated to 17mm    Family History  Problem Relation Age of Onset  . Colon cancer Neg Hx   . Colon polyps Neg Hx   . Anesthesia problems Neg Hx   . Hypotension Neg Hx   . Malignant hyperthermia Neg Hx   . Pseudochol deficiency Neg Hx     History  Substance Use Topics    . Smoking status: Former Smoker -- 1.0 packs/day    Types: Cigarettes    Quit date: 08/27/1995  . Smokeless tobacco: Current User    Types: Chew  . Alcohol Use: No      Review of Systems  10 Systems reviewed and are negative for acute change except as noted in the HPI.   Allergies  Dairy aid  Home Medications   Current Outpatient Rx  Name Route Sig Dispense Refill  . AMITRIPTYLINE HCL 75 MG PO TABS Oral Take 1 tablet (75 mg total) by mouth at bedtime. 30 tablet 0    Patient needs to make appointment for follow up  . DULOXETINE HCL 60 MG PO CPEP Oral Take 60 mg by mouth daily.      Marland Kitchen HYDROCODONE-ACETAMINOPHEN 7.5-500 MG/15ML PO SOLN Oral Take 10 mLs by mouth 4 (four) times daily as needed. pain     . LORAZEPAM 1 MG PO TABS Oral Take 1 mg by mouth 2 (two) times daily.      Marland Kitchen NIFEDIPINE 10 MG PO CAPS Oral Take 10 mg by mouth daily.      Marland Kitchen OMEPRAZOLE 20 MG PO CPDR Oral Take 20 mg by mouth daily. Patient takes in the evenings     . PRAVASTATIN SODIUM 40 MG PO TABS  Oral Take 40 mg by mouth daily.     Marland Kitchen PROMETHAZINE HCL 25 MG PO TABS  1-2 PO Q4-6H PRN NAUSEA AND VOMITING 30 tablet 5  . QUETIAPINE FUMARATE 200 MG PO TB24 Oral Take 200 mg by mouth at bedtime.      . SUCRALFATE 1 GM/10ML PO SUSP Oral Take 1 g by mouth 4 (four) times daily.      Marland Kitchen ONDANSETRON 8 MG PO TBDP Oral Take 1 tablet (8 mg total) by mouth every 8 (eight) hours as needed for nausea. 20 tablet 0    BP 110/74  Pulse 104  Temp(Src) 98.6 F (37 C) (Oral)  Resp 18  Ht 6\' 1"  (1.854 m)  Wt 180 lb (81.647 kg)  BMI 23.75 kg/m2  SpO2 96%  Physical Exam  Nursing note and vitals reviewed. Constitutional: He is oriented to person, place, and time. He appears well-developed and well-nourished. No distress.  HENT:  Head: Normocephalic and atraumatic.       pharyngeal erythema  Eyes: EOM are normal. Pupils are equal, round, and reactive to light.  Neck: Normal range of motion. Neck supple. No tracheal deviation  present.  Cardiovascular: Normal rate and regular rhythm.   Pulmonary/Chest: Effort normal and breath sounds normal. No respiratory distress.  Abdominal: Soft. He exhibits no distension. There is no rebound and no guarding.       Mild diffuse tenderness worse in epigastrial region  Musculoskeletal: Normal range of motion. He exhibits no edema.  Neurological: He is alert and oriented to person, place, and time. No sensory deficit.  Skin: Skin is warm and dry.  Psychiatric: He has a normal mood and affect. His behavior is normal.    ED Course  Procedures (including critical care time) DIAGNOSTIC STUDIES: Oxygen Saturation is 95% on room air, normal by my interpretation.    COORDINATION OF CARE:    Labs Reviewed  DIFFERENTIAL - Abnormal; Notable for the following:    Neutrophils Relative 87 (*)    Neutro Abs 8.8 (*)    Lymphocytes Relative 7 (*)    All other components within normal limits  COMPREHENSIVE METABOLIC PANEL - Abnormal; Notable for the following:    Potassium 3.1 (*)    Glucose, Bld 124 (*)    Creatinine, Ser 1.47 (*)    Total Protein 8.4 (*)    GFR calc non Af Amer 51 (*)    GFR calc Af Amer 59 (*)    All other components within normal limits  CBC  LIPASE, BLOOD   Dg Abd Acute W/chest  08/24/2011  *RADIOLOGY REPORT*  Clinical Data: Vomiting.  Diarrhea, nausea.  History of dysfunctional esophagus.  ACUTE ABDOMEN SERIES (ABDOMEN 2 VIEW & CHEST 1 VIEW)  Comparison: 07/15/2011  Findings: Heart size is normal.  The lungs are free of focal consolidations and pleural effusions.  Note is made of density to the right of the lower thoracic spine, consistent with a dilated esophagus as seen on previous CT exams.  Supine and erect views of the abdomen show a nonobstructive bowel gas pattern.  Phleboliths are identified within the pelvis.  IMPRESSION:  1. No evidence for acute cardiopulmonary abnormality. 2.  Nonobstructive bowel gas pattern.  Original Report Authenticated By:  Patterson Hammersmith, M.D.     1. Nausea vomiting and diarrhea      Date: 08/24/2011  Rate: 112  Rhythm: sinus tachycardia  QRS Axis: normal  Intervals: normal  ST/T Wave abnormalities: normal  Conduction Disutrbances:none  Narrative  Interpretation:   Old EKG Reviewed: unchanged    MDM  Patient presented nausea vomiting diarrhea. He is a history of multiple esophageal problems. He states this feels different. Lab work is reassuring. Has a mild hypokalemia which he has had before. Is tolerated orals here. His heart rate is improved. Feels as if he will now. He has Phenergan at home, but added Zofran if he needs it. He'll followup with his primary care her GI as needed.  I personally performed the services described in this documentation, which was scribed in my presence. The recorded information has been reviewed and considered.        Juliet Rude. Rubin Payor, MD 08/24/11 [phone]

## 2011-08-24 NOTE — ED Notes (Signed)
Pt presents with n/v/d, abdominal pain and chills since last night. Pt has extensive history with esophageal problems.

## 2011-08-26 ENCOUNTER — Other Ambulatory Visit (HOSPITAL_COMMUNITY): Payer: Self-pay | Admitting: Psychiatry

## 2011-10-17 ENCOUNTER — Encounter: Payer: Self-pay | Admitting: Gastroenterology

## 2011-10-17 ENCOUNTER — Ambulatory Visit (INDEPENDENT_AMBULATORY_CARE_PROVIDER_SITE_OTHER): Payer: Medicare Other | Admitting: Gastroenterology

## 2011-10-17 VITALS — BP 115/83 | HR 85 | Temp 98.4°F | Ht 73.0 in | Wt 192.6 lb

## 2011-10-17 DIAGNOSIS — K220 Achalasia of cardia: Secondary | ICD-10-CM

## 2011-10-17 MED ORDER — OMEPRAZOLE 20 MG PO CPDR: 20.0000 mg | DELAYED_RELEASE_CAPSULE | Freq: Every day | ORAL | Status: DC

## 2011-10-17 NOTE — Assessment & Plan Note (Signed)
Sx controlled by prayer and diet.  Refill OMP for 1 year. OPV in 6 mos.

## 2011-10-17 NOTE — Progress Notes (Signed)
Reminder in epic to follow up with SF in E30 

## 2011-10-17 NOTE — Progress Notes (Signed)
Faxed to PCP

## 2011-10-17 NOTE — Patient Instructions (Signed)
I REFILLED YOUR OMEPRAZOLE FOR ONE YEAR. FOLLOW UP IN 6 MOS.

## 2011-10-17 NOTE — Progress Notes (Signed)
  Subjective:    Patient ID: Walter Herring, male    DOB: Jan 27, 1956, 56 y.o.   MRN: [id number]  HPI PT DOING WELL. WEIGHT STABLE. PRAYED TO GOD FOR DELIVERANCE & RECEIVED IT. NO QUESTIONS OR CONCERNS. Needs refills on his OMP.  Past Medical History  Diagnosis Date  . DDD (degenerative disc disease)   . GERD (gastroesophageal reflux disease)   . COPD (chronic obstructive pulmonary disease)   . Hypercholesterolemia   . Anxiety   . Achalasia 2008 168 LBS  . Chronic abdominal pain     AUG 2010 CT ABD W/ IVC-NL PANCREAS, GB, LIVER  . Hypercholesteremia   . Dysphagia     Past Surgical History  Procedure Date  . 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 CARL WESTCOTT  . Esophagogastroduodenoscopy      MAR 2012 (RMR) TTS DIL 20 MM, JAN 2010 Gastritis  . Hemorrhoid surgery   . Colonoscopy 2009 [facility]    Normal  . Savory dilation 05/03/2011    Procedure: SAVORY DILATION;  Surgeon: Arlyce Harman, MD;  Location: [facility];  Service: Endoscopy;  Laterality: N/A;  dilated to 17mm    Allergies  Allergen Reactions  . Dairy Aid (Lactase) Nausea And Vomiting    Current Outpatient Prescriptions  Medication Sig Dispense Refill  . amitriptyline (ELAVIL) 75 MG tablet Take 1 tablet (75 mg total) by mouth at bedtime.    . DULoxetine (CYMBALTA) 60 MG capsule Take 60 mg by mouth daily.      Marland Kitchen LORazepam (ATIVAN) 1 MG tablet Take 1 mg by mouth 2 (two) times daily.      Marland Kitchen NIFEdipine (PROCARDIA) 10 MG capsule Take 10 mg by mouth daily.      Marland Kitchen omeprazole (PRILOSEC) 20 MG capsule Take 1 capsule (20 mg total) by mouth daily. Patient takes in the evenings    . oxyCODONE-acetaminophen (PERCOCET) 7.5-325 MG per tablet Take 1 tablet by mouth every 6 (six) hours as needed.     . pravastatin (PRAVACHOL) 40 MG tablet Take 40 mg by mouth daily.     . promethazine (PHENERGAN) 25 MG tablet 1-2 PO Q4-6H PRN NAUSEA AND VOMITING    . QUEtiapine (SEROQUEL XR) 200 MG 24 hr tablet Take 200 mg by mouth at bedtime.          Review of  Systems     Objective:   Physical Exam  Constitutional: He is oriented to person, place, and time. He appears well-developed and well-nourished. No distress.       Looks healthy  Cardiovascular: Normal rate, regular rhythm and normal heart sounds.   Pulmonary/Chest: Effort normal and breath sounds normal. No respiratory distress.  Abdominal: Soft. Bowel sounds are normal. He exhibits no distension. There is no tenderness.  Musculoskeletal: He exhibits no edema.  Neurological: He is alert and oriented to person, place, and time.       NO  NEW FOCAL DEFICITS   Psychiatric: He has a normal mood and affect.          Assessment & Plan:

## 2012-01-02 ENCOUNTER — Encounter: Payer: Self-pay | Admitting: Urgent Care

## 2012-01-02 ENCOUNTER — Ambulatory Visit (INDEPENDENT_AMBULATORY_CARE_PROVIDER_SITE_OTHER): Payer: Medicare Other | Admitting: Urgent Care

## 2012-01-02 VITALS — BP 124/86 | HR 88 | Temp 97.4°F | Ht 73.0 in | Wt 200.0 lb

## 2012-01-02 DIAGNOSIS — R1013 Epigastric pain: Secondary | ICD-10-CM

## 2012-01-02 DIAGNOSIS — R110 Nausea: Secondary | ICD-10-CM

## 2012-01-02 DIAGNOSIS — K220 Achalasia of cardia: Secondary | ICD-10-CM

## 2012-01-02 MED ORDER — OMEPRAZOLE 20 MG PO CPDR: 20.0000 mg | DELAYED_RELEASE_CAPSULE | Freq: Two times a day (BID) | ORAL | Status: DC

## 2012-01-02 NOTE — Progress Notes (Signed)
Referring Provider: Isabella Stalling, MD Primary Care Physician:  Isabella Stalling, MD, MD Primary Gastroenterologist:  Dr. Darrick Penna  Chief Complaint  Patient presents with  . Emesis  . Nausea  . Abdominal Pain    HPI:  Walter Herring is a 56 y.o. male here for follow up for nausea and abdominal pain. Walter Herring has straight achalasia and is status post Heller myotomy in 2008 by Dr. Francee Gentile. He was actually seen by Dr. Darrick Penna and 2012 with similar symptoms and referred to back to Dr. Carolynn Sayers. In December he was seen by Dr. Carolynn Sayers and plans were for redo of his Heller myotomy, however he prayed over the situation and felt as though his symptoms had resolved, so he decided no to follow through with surgery. He had been doing really well when he saw Dr. Darrick Penna back in March, however since that time he's had increasing episodes of postprandial severe epigastric pain along with nausea. He complains of "sour stomach". He tells me if he bends over he immediately regurgitates his food. The pain is intermittent, lasts for hours.  He is taking omeprazole 20mg  daily.  He has been gaining weight however. BM ok.  Denies rectal bleeding.  Denies any problems with dysphagia or odynophagia.  Appetite ok.      Past Medical History  Diagnosis Date  . DDD (degenerative disc disease)   . GERD (gastroesophageal reflux disease)   . COPD (chronic obstructive pulmonary disease)   . Hypercholesterolemia   . Anxiety   . Achalasia 2008 168 LBS  . Chronic abdominal pain     AUG 2010 CT ABD W/ IVC-NL PANCREAS, GB, LIVER  . Hypercholesteremia   . Dysphagia     Past Surgical History  Procedure Date  . 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 CARL WESTCOTT  . Esophagogastroduodenoscopy      MAR 2012 (RMR) TTS DIL 20 MM, JAN 2010 Gastritis  . Hemorrhoid surgery   . Colonoscopy 2009 [facility]    Normal  . Savory dilation 05/03/2011    Procedure: SAVORY DILATION;  Surgeon: Arlyce Harman, MD;  Location: [facility];  Service:  Endoscopy;  Laterality: N/A;  dilated to 17mm    Current Outpatient Prescriptions  Medication Sig Dispense Refill  . amitriptyline (ELAVIL) 75 MG tablet Take 1 tablet (75 mg total) by mouth at bedtime.  30 tablet  0  . DULoxetine (CYMBALTA) 60 MG capsule Take 60 mg by mouth daily.        Marland Kitchen LORazepam (ATIVAN) 1 MG tablet Take 1 mg by mouth 2 (two) times daily.        Marland Kitchen NIFEdipine (PROCARDIA) 10 MG capsule Take 10 mg by mouth daily.        Marland Kitchen omeprazole (PRILOSEC) 20 MG capsule Take 1 capsule (20 mg total) by mouth 2 (two) times daily before a meal. Patient takes in the evenings  62 capsule  11  . oxyCODONE-acetaminophen (PERCOCET) 7.5-325 MG per tablet Take 1 tablet by mouth every 6 (six) hours as needed.       . pravastatin (PRAVACHOL) 40 MG tablet Take 40 mg by mouth daily.       . promethazine (PHENERGAN) 25 MG tablet 1-2 PO Q4-6H PRN NAUSEA AND VOMITING  30 tablet  5  . QUEtiapine (SEROQUEL XR) 200 MG 24 hr tablet Take 200 mg by mouth at bedtime.        Marland Kitchen omeprazole (PRILOSEC) 20 MG capsule 20 mg daily. 1 PO 30 MINUTES PRIOR TO MEALS  twice daily         Allergies as of 01/02/2012 - Review Complete 01/02/2012  Allergen Reaction Noted  . Dairy aid (lactase) Nausea And Vomiting 12/06/2010  Review of Systems: Gen: Denies any fever, chills, sweats, anorexia, fatigue, weakness, malaise, weight loss, and sleep disorder. CV: Denies chest pain, angina, palpitations, syncope, orthopnea, PND, peripheral edema, and claudication. Resp: Denies dyspnea at rest, dyspnea with exercise, cough, sputum, wheezing, coughing up blood, and pleurisy. GI: Denies vomiting blood, jaundice, and fecal incontinence. Derm: Denies rash, itching, dry skin, hives, moles, warts, or unhealing ulcers.  Psych: Denies depression, anxiety, memory loss, suicidal ideation, hallucinations, paranoia, and confusion. Heme: Denies bruising, bleeding, and enlarged lymph nodes.  Physical Exam: BP 124/86  Pulse 88  Temp(Src) 97.4 F  (36.3 C) (Temporal)  Ht 6\' 1"  (1.854 m)  Wt 200 lb (90.719 kg)  BMI 26.39 kg/m2 General:   Alert,  Well-developed, well-nourished, pleasant and cooperative in NAD.  Accompanied by his wife today. Eyes:  Sclera clear, no icterus.   Conjunctiva pink. Mouth:  No deformity or lesions, oropharynx pink and moist. Neck:  Supple; no masses or thyromegaly. Heart:  Regular rate and rhythm; no murmurs, clicks, rubs,  or gallops. Abdomen:  Normal bowel sounds.  No bruits.  Soft, non-tender and non-distended without masses, hepatosplenomegaly or hernias noted.  No guarding or rebound tenderness.   Rectal:  Deferred. Msk:  Symmetrical without gross deformities.  Pulses:  Normal pulses noted. Extremities:  No clubbing or edema. Neurologic:  Alert and oriented x4;  grossly normal neurologically. Skin:  Intact without significant lesions or rashes.

## 2012-01-02 NOTE — Patient Instructions (Signed)
Increase omeprazole to 20 mg before your first meal a day and dinner Call Monday with a progress report or sooner if worse

## 2012-01-03 NOTE — Assessment & Plan Note (Addendum)
Walter Herring is a pleasant 56 y.o. male with history of achalasia status post Heller myotomy in 2000 he. He has had problems with recurrent postprandial epigastric pain and nausea, as well as GERD type symptoms.  He is very reluctant to proceed with recommended redo of Heller myotomy by Dr. Francee Gentile.  Ultimately, this may be his only option.  He is going to think about this and discuss this further with his wife at home. In the meantime we'll increase his omeprazole to 20 mg twice a day. He is to call me back next week with his decision. I did discuss with Dr.Fields as well and she has no other options to offer him here at [facility] at this point.

## 2012-01-06 NOTE — Progress Notes (Signed)
Faxed to PCP

## 2012-01-30 NOTE — Progress Notes (Signed)
REVIEWED.  

## 2012-02-21 ENCOUNTER — Other Ambulatory Visit: Payer: Self-pay | Admitting: Gastroenterology

## 2012-03-30 ENCOUNTER — Ambulatory Visit (HOSPITAL_COMMUNITY)
Admission: RE | Admit: 2012-03-30 | Discharge: 2012-03-30 | Disposition: A | Discharge status: Home / Self Care | Payer: Medicare Other | Source: Ambulatory Visit | Attending: Family Medicine | Admitting: Family Medicine

## 2012-03-30 ENCOUNTER — Other Ambulatory Visit (HOSPITAL_COMMUNITY): Payer: Self-pay | Admitting: Family Medicine

## 2012-03-30 DIAGNOSIS — M1990 Unspecified osteoarthritis, unspecified site: Secondary | ICD-10-CM

## 2012-03-30 DIAGNOSIS — R52 Pain, unspecified: Secondary | ICD-10-CM

## 2012-03-30 DIAGNOSIS — M7730 Calcaneal spur, unspecified foot: Secondary | ICD-10-CM | POA: Insufficient documentation

## 2012-03-30 DIAGNOSIS — M542 Cervicalgia: Secondary | ICD-10-CM | POA: Insufficient documentation

## 2012-03-30 DIAGNOSIS — M25579 Pain in unspecified ankle and joints of unspecified foot: Secondary | ICD-10-CM | POA: Insufficient documentation

## 2012-04-15 ENCOUNTER — Encounter: Payer: Self-pay | Admitting: Gastroenterology

## 2012-04-16 ENCOUNTER — Encounter: Payer: Self-pay | Admitting: Gastroenterology

## 2012-04-16 ENCOUNTER — Ambulatory Visit (INDEPENDENT_AMBULATORY_CARE_PROVIDER_SITE_OTHER): Payer: Medicare Other | Admitting: Gastroenterology

## 2012-04-16 ENCOUNTER — Telehealth: Payer: Self-pay | Admitting: Gastroenterology

## 2012-04-16 VITALS — BP 84/57 | HR 101 | Temp 98.6°F | Ht 73.0 in | Wt 206.8 lb

## 2012-04-16 DIAGNOSIS — R1013 Epigastric pain: Secondary | ICD-10-CM

## 2012-04-16 MED ORDER — LIDOCAINE VISCOUS 2 % MT SOLN: OROMUCOSAL | Status: DC

## 2012-04-16 NOTE — Progress Notes (Signed)
Faxed to PCP

## 2012-04-16 NOTE — Telephone Encounter (Signed)
REVIEWED.  

## 2012-04-16 NOTE — Progress Notes (Signed)
Subjective:    Patient ID: Walter Herring, male    DOB: 1956/05/22, 56 y.o.   MRN: [id number]  PCP: DONDIEGO  HPI  REVIEWED OLD CHART FROM 2008 TO PRESENT. Weight: 185 lbs, FEB 2013: 192 LBS, AND today 207 lbs.  WAS DOING BETTER. STARTED EATING AND KEEPING EVERYTHING DOWN AND DECIDED NOT TO HAVE SURGERY. NOW HAVING PAIN IN EPIGATRIUM FO RONE MO. DR. DONDIEGO CHECKED LABS AND X-RAYS. IF BENDS OVER AFTER EATING, FOOD WILL COME BACK UP. NO VOMITING. BURNING PAIN. FEELS WEIRD NOW BUT DIDN'T EAT BREAKFAST. RARE PHENERGAN. LAST DOSE 1 WEEK AGO. PT STATES HE HAD TRIED BOTOX/?PNEUMATIC DILATION IN THE PAST AND DIDN'T FEEL LIKE IT HELPED. NO DIARRHEA OR CONSTIPATION. RARE GOODY POWDERS. USING ADVIL OTC(2) 2-3 TIMES A WEEK. LAST BM tue.  Past Medical History  Diagnosis Date  . DDD (degenerative disc disease)   . GERD (gastroesophageal reflux disease)   . COPD (chronic obstructive pulmonary disease)   . Hypercholesterolemia   . Anxiety   . Achalasia 2008 168 LBS  . Chronic abdominal pain     AUG 2010 CT ABD W/ IVC-NL PANCREAS, GB, LIVER  . Hypercholesteremia   . Dysphagia     Past Surgical History  Procedure Date  . 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 CARL WESTCOTT  . Esophagogastroduodenoscopy      MAR 2012 (RMR) TTS DIL 20 MM, JAN 2010 Gastritis  . Hemorrhoid surgery   . Colonoscopy 2009 [facility]    Normal  . Savory dilation 05/03/2011    Stricture/mild gastritis   Allergies  Allergen Reactions  . Dairy Aid (Lactase) Nausea And Vomiting    Current Outpatient Prescriptions  Medication Sig Dispense Refill  . albuterol (PROVENTIL) (2.5 MG/3ML) 0.083% nebulizer solution Take 2.5 mg by nebulization every 6 (six) hours as needed.      Marland Kitchen amitriptyline (ELAVIL) 75 MG tablet Take 1 tablet (75 mg total) by mouth at bedtime.    . DULoxetine (CYMBALTA) 60 MG capsule Take 60 mg by mouth daily.      Marland Kitchen LORazepam (ATIVAN) 1 MG tablet Take 1 mg by mouth 2 (two) times daily.      Marland Kitchen NIFEdipine (PROCARDIA) 10 MG  capsule Take 10 mg by mouth daily.      Marland Kitchen omeprazole (PRILOSEC) 20 MG capsule Take 1 capsule (20 mg total) by  daily before a meal.      . oxyCODONE-acetaminophen (PERCOCET) 7.5-325 MG per tablet Take 1 tablet by mouth every 6 (six) hours as needed.     . pravastatin (PRAVACHOL) 40 MG tablet Take 40 mg by mouth daily.     . promethazine (PHENERGAN) 25 MG tablet Take 1 tablet (25 mg total) by mouth every 6 (six) hours as needed for nausea.    Marland Kitchen QUEtiapine (SEROQUEL XR) 200 MG 24 hr tablet Take 200 mg by mouth at bedtime.        .             Review of Systems     Objective:   Physical Exam  Vitals reviewed. Constitutional: He is oriented to person, place, and time. He appears well-nourished. No distress.  HENT:  Head: Normocephalic and atraumatic.  Mouth/Throat: Oropharynx is clear and moist. No oropharyngeal exudate.  Eyes: Pupils are equal, round, and reactive to light. No scleral icterus.  Neck: Normal range of motion. Neck supple.  Cardiovascular: Normal rate, regular rhythm and normal heart sounds.   Pulmonary/Chest: Effort normal and breath sounds normal. No respiratory distress.  Abdominal: Soft. Bowel sounds are normal. He exhibits no distension. There is tenderness. There is no rebound and no guarding.       MILD  To mod TTP IN THE EPIGASTRIUM    Musculoskeletal: Normal range of motion. He exhibits no edema.  Lymphadenopathy:    He has no cervical adenopathy.  Neurological: He is alert and oriented to person, place, and time.       NO  NEW FOCAL DEFICITS   Psychiatric:       Flat affect, nl mood          Assessment & Plan:

## 2012-04-16 NOTE — Telephone Encounter (Signed)
Patient has been referred back to Dr. Lorin Picket and he has an appointment on 05/08/12 at 2:45 and patient is aware

## 2012-04-16 NOTE — Assessment & Plan Note (Addendum)
MOST LIKELY DUE TO ESOPHAGITIS DUE TO FOOD/PILL STASIS(GOODY POWDER/ADVIL) AND LESS LIKELY REFLUX.   CONTINUE TO CRUSH MEDS.  Use viscous lidocaine to help relieve your pain.  Take Prilosec 30 minutes prior to meals twice a day.  Full liquid diet for one week. USE 5 CANS OF ENSURE OF BOOST A DAY. HO GIVEN.  DISCUSSED OPTIONS FOR MANAGING YOUR ESOPHAGUS: 1. BOTOX, 2. PNEUMATIC DILATION, OR 3. REPEAT SURGERY. PT WILL SEE DR. Lorin Picket TO DISCUSS.  FOLLOW UP IN 2 MOS.

## 2012-04-16 NOTE — Patient Instructions (Addendum)
Use viscous lidocaine to help relieve your pain.  Take Prilosec 30 minutes prior to meals twice a day.  Follow Full liquid diet for one week. USE 5 CANS OF ENSURE OF BOOST A DAY.  SEE DR. Lorin Picket TO DISCUSS OPTIONS FOR MANAGING YOUR ESOPHAGUS: 1. BOTOX, 2. PNEUMATIC DILATION, OR 3. REPEAT ESOPHAGUS SURGERY.  FOLLOW UP IN 2 MOS.   Full Liquid Diet A high-calorie, high-protein supplement should be used to meet your nutritional requirements when the full liquid diet is continued for more than 2 or 3 days. If this diet is to be used for an extended period of time (more than 7 days), a multivitamin should be considered.  Breads and Starches  Allowed: None are allowed except crackers, WHOLE OR pureed (made into a thick, smooth soup) in soup. Cooked, refined corn, oat, rice, rye, and wheat cereals are also allowed.   Avoid: Any others.    Potatoes/Pasta/Rice  Allowed: ANY ITEM AS A SOUP OR SMALL PLATE OF MASHED POTATOES OR RICE.       Vegetables  Allowed: Strained tomato or vegetable juice. Vegetables pureed in soup.   Avoid: Any others.    Fruit  Allowed: Any strained fruit juices and fruit drinks. Include 1 serving of citrus or vitamin C-enriched fruit juice daily.   Avoid: Any others.  Meat and Meat Substitutes  Allowed: Egg  Avoid: Any meat, fish, or fowl. All cheese.  Milk  Allowed: Milk beverages, including milk shakes and instant breakfast mixes. Smooth yogurt.   Avoid: Any others. Avoid dairy products if not tolerated.    Soups and Combination Foods  Allowed: Broth, strained cream soups. Strained, broth-based soups.   Avoid: Any others.    Desserts and Sweets  Allowed: flavored gelatin, tapioca, plain ice cream, sherbet, smooth pudding, junket, fruit ices, frozen ice pops, pudding pops,, frozen fudge pops, chocolate syrup. Sugar, honey, jelly, syrup.   Avoid: Any others.  Fats and Oils  Allowed: Margarine, butter, cream, sour cream, oils.   Avoid:  Any others.  Beverages  Allowed: All.   Avoid: None.  Condiments  Allowed: Iodized salt, pepper, spices, flavorings. Cocoa powder.   Avoid: Any others.    SAMPLE MEAL PLAN Breakfast   cup orange juice.   1 cup cooked wheat cereal.   1 cup SOY milk.   1 cup beverage (coffee or tea).   Cream or sugar, if desired.    Midmorning Snack  2 SCRAMBLED OR HARD BOILED EGG   Lunch  1 cup cream soup.    cup fruit juice.   1 cup SOY milk.    cup custard.   1 cup beverage (coffee or tea).   Cream or sugar, if desired.    Midafternoon Snack  1 cup  milk shake.  Dinner  1 cup cream soup.    cup fruit juice.   1 cup milk.    cup pudding.   1 cup beverage (coffee or tea).   Cream or sugar, if desired.  Evening Snack  1 cup supplement.  To increase calories, add sugar, cream, butter, or margarine if possible. Nutritional supplements will also increase the total calories.

## 2012-04-22 NOTE — Progress Notes (Addendum)
Mar 30 2012: HB 13.4 MCV 83.8 PLT 230 ESR 7 K 4.7 ALB 3.8 AST 34 ALT 35 ALK PHOS 71 T BILI 0.5 CR 0.94

## 2012-07-01 ENCOUNTER — Other Ambulatory Visit: Payer: Self-pay

## 2012-07-02 MED ORDER — PROMETHAZINE HCL 25 MG PO TABS: 25.0000 mg | ORAL_TABLET | Freq: Four times a day (QID) | ORAL | Status: DC | PRN

## 2012-08-31 ENCOUNTER — Other Ambulatory Visit: Payer: Self-pay | Admitting: Gastroenterology

## 2012-10-25 ENCOUNTER — Other Ambulatory Visit: Payer: Self-pay | Admitting: Gastroenterology

## 2012-10-26 NOTE — Telephone Encounter (Signed)
Needs follow-up with Dr. Darrick Penna, was to be seen around Oct/Nov 2013.  1 refill provided.

## 2012-10-27 ENCOUNTER — Encounter: Payer: Self-pay | Admitting: Gastroenterology

## 2012-10-27 NOTE — Telephone Encounter (Signed)
Pt is aware of OV on 4/3 at 230 with SF and appt card was mailed

## 2012-11-24 ENCOUNTER — Other Ambulatory Visit: Payer: Self-pay | Admitting: Gastroenterology

## 2012-11-26 ENCOUNTER — Ambulatory Visit (INDEPENDENT_AMBULATORY_CARE_PROVIDER_SITE_OTHER): Payer: Medicare Other | Admitting: Gastroenterology

## 2012-11-26 ENCOUNTER — Encounter: Payer: Self-pay | Admitting: Gastroenterology

## 2012-11-26 ENCOUNTER — Ambulatory Visit (HOSPITAL_COMMUNITY)
Admission: RE | Admit: 2012-11-26 | Discharge: 2012-11-26 | Disposition: A | Discharge status: Home / Self Care | Payer: Medicare Other | Source: Ambulatory Visit | Attending: Gastroenterology | Admitting: Gastroenterology

## 2012-11-26 VITALS — BP 107/72 | HR 96 | Temp 97.9°F | Ht 73.0 in | Wt 209.2 lb

## 2012-11-26 DIAGNOSIS — K5900 Constipation, unspecified: Secondary | ICD-10-CM

## 2012-11-26 DIAGNOSIS — R109 Unspecified abdominal pain: Secondary | ICD-10-CM

## 2012-11-26 DIAGNOSIS — R1033 Periumbilical pain: Secondary | ICD-10-CM | POA: Insufficient documentation

## 2012-11-26 DIAGNOSIS — J449 Chronic obstructive pulmonary disease, unspecified: Secondary | ICD-10-CM | POA: Insufficient documentation

## 2012-11-26 DIAGNOSIS — J4489 Other specified chronic obstructive pulmonary disease: Secondary | ICD-10-CM | POA: Insufficient documentation

## 2012-11-26 DIAGNOSIS — K220 Achalasia of cardia: Secondary | ICD-10-CM

## 2012-11-26 MED ORDER — LIDOCAINE VISCOUS 2 % MT SOLN: OROMUCOSAL | Status: DC

## 2012-11-26 NOTE — Assessment & Plan Note (Signed)
MOST LIKELY DUE TO FUNCTIONAL ABD PAIN V. IBS-C, LESS LIKELY NSAID ENTEROPATHY/COLOPATHY.  SX NOT TYPICAL FOR CHRONIC MESENTERIC ISCHEMIA  I PERSONALLY REVIEWED CT 2010 WITH DR. Tyron Russell. NO ASCVD AT ORIGIN OF CELIAC/SMA/IMA ORIGIN. AAS TODAY PT SHOULD AVOID GOODY POWDERS. OPV IN 4 MOS.

## 2012-11-26 NOTE — Progress Notes (Signed)
CC PCP 

## 2012-11-26 NOTE — Progress Notes (Signed)
Subjective:    Patient ID: Walter Herring, male    DOB: 02-Aug-1956, 57 y.o.   MRN: [id number]  PCP: [id number]  HPI Feels like if he bends over some things come back up. FEELS QUEASY AFTERWARDS. RARE PHENERGAN. Snacks a lot on sweets and cookies. GAINED 3 LBS SINCE AUG 2013. BMs: 2X Q2-3 DAYS. PAIN IS IN THE MIDDLE AND RUQ. THE RUQ PAIN GETS WITH VISCOUS LIDOCAINE. STILL TAKING GOODYS. FOOD Herring DOWN/SWALLOWING IS OKAY. SHARP PAIN CONSTANT UNTIL BENDS OVER/SPITS UP. NO BETTER WITH BETTER WITH PASSING GAS OR HAVING A BM.  GAINED 3 LBS SINCE AUG 2013. NEVER SAW DR. Byrd Hesselbach.  PT DENIES FEVER, CHILLS, BRBPR, nausea, vomiting, melena, heartburn or indigestion.   Past Medical History  Diagnosis Date  . DDD (degenerative disc disease)   . GERD (gastroesophageal reflux disease)   . COPD (chronic obstructive pulmonary disease)   . Hypercholesterolemia   . Anxiety   . Achalasia 2008 168 LBS  . Chronic abdominal pain     AUG 2010 CT ABD W/ IVC-NL PANCREAS, GB, LIVER  . Hypercholesteremia   . Dysphagia     Past Surgical History  Procedure Laterality Date  . Heller myotomy  DEC 2008 DR. CARL WESTCOTT  . Esophagogastroduodenoscopy       MAR 2012 (RMR) TTS DIL 20 MM, JAN 2010 Gastritis  . Hemorrhoid surgery    . Colonoscopy  2009 [facility]    Normal  . Savory dilation  05/03/2011    Stricture/mild gastritis   Allergies  Allergen Reactions  . Dairy Aid (Lactase) Nausea And Vomiting    Current Outpatient Prescriptions  Medication Sig Dispense Refill  . albuterol (PROVENTIL) (2.5 MG/3ML) 0.083% nebulizer solution Take 2.5 mg by nebulization every 6 (six) hours as needed.      . DULoxetine (CYMBALTA) 60 MG capsule Take 60 mg by mouth daily.        . fluticasone (FLONASE) 50 MCG/ACT nasal spray Place 1 spray into the nose daily.       Marland Kitchen LORazepam (ATIVAN) 1 MG tablet Take 1 mg by mouth 2 (two) times daily.        .      . oxyCODONE-acetaminophen (PERCOCET) 7.5-325 MG per tablet Take 1 tablet by  mouth every 6 (six) hours as needed.     . pravastatin (PRAVACHOL) 40 MG tablet Take 40 mg by mouth daily.     . promethazine (PHENERGAN) 25 MG tablet TAKE ONE TABLET BY MOUTH EVERY 6 HOURS AS NEEDED FOR NAUSEA    . QUEtiapine (SEROQUEL) 400 MG tablet Take 400 mg by mouth at bedtime.     Marland Kitchen amitriptyline (ELAVIL) 75 MG tablet Take 1 tablet (75 mg total) by mouth at bedtime.    . lidocaine (XYLOCAINE) 2 % solution TAKE TWO TEASPOONFULS OF SOLUTION EVERY 4 TO 6 HOURS AS NEEDED FOR CHEST OR STOMACH PAIN. (NO MORE THAN 8 DOSES A DAY)    . omeprazole (PRILOSEC) 20 MG capsule 20 mg daily. 1 PO 30 MINUTES PRIOR TO MEALS twice daily             Review of Systems LAST CT A/P 2010: NAIAP    Objective:   Physical Exam  Vitals reviewed. Constitutional: He is oriented to person, place, and time. He appears well-nourished.  HENT:  Head: Normocephalic and atraumatic.  Mouth/Throat: Oropharynx is clear and moist. No oropharyngeal exudate.  Eyes: Pupils are equal, round, and reactive to light. No scleral icterus.  Neck: Normal range of  motion. Neck supple.  Cardiovascular: Normal rate, regular rhythm and normal heart sounds.   Pulmonary/Chest: Effort normal and breath sounds normal. No respiratory distress.  Abdominal: Bowel sounds are normal. He exhibits distension (MILD). There is no tenderness. There is no rebound and no guarding.  Musculoskeletal: He exhibits no edema.  Neurological: He is alert and oriented to person, place, and time.  NO FOCAL DEFICITS   Psychiatric:  FLAT AFFECT, NL MOOD           Assessment & Plan:

## 2012-11-26 NOTE — Assessment & Plan Note (Signed)
PT NEVER SAW DR. Lorin Picket. SWALLOWING BASELINE.  MONITOR SX. WILL RSC IF PTS DYSPHAGIA WORSENS OR HE BEGINS TO LOSE WEIGHT OPV IN 4 MOS.

## 2012-11-26 NOTE — Assessment & Plan Note (Signed)
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  DRINK WATER TO KEEP URINE LIGHT YELLOW.  MAY USE MOM ONCE OR TWICE A DAY.   FOLLOW UP IN AUG 2014.

## 2012-11-26 NOTE — Patient Instructions (Addendum)
COMPLETE XRAY.  TRY TO PUT FIBER DIET IN YOUR DIET AS TOLERATED.  DRINK WATER TO KEEP URINE LIGHT YELLOW.  YOU MAY USE MILK OF MAGNESIA ONCE OR TWICE A DAY TO HELP YOUR BOWELS MOVE.   FOLLOW UP IN AUG 2014.

## 2012-11-26 NOTE — Progress Notes (Addendum)
Called patient TO DISCUSS RESULTS. EXPLAINED RESULTS AAS: CONSTIPATION MOST LIKELY CAUSE FOR A  PERIUMBILICAL ABD PAIN.  MIRALAX TID FOR 3 DAYS. ADD DULCOLAX 10 MG BID FOR 3 DAYS. CALL ON MON APR 7 IF NO BM. WOULD CONSIDER LINZESS OR AMITIZA.

## 2012-11-27 ENCOUNTER — Telehealth: Payer: Self-pay

## 2012-11-27 MED ORDER — POLYETHYLENE GLYCOL 3350 17 GM/SCOOP PO POWD: ORAL | Status: DC

## 2012-11-27 NOTE — Telephone Encounter (Signed)
Pt's wife called and said Dr. Evelina Dun called yesterday and said pt needs to take Miralax tid x 3 days. She had just a little but her refills had expired. She would like a Rx sent to [facility] in [place] please.

## 2012-11-27 NOTE — Telephone Encounter (Signed)
RX sent electronically 

## 2012-11-27 NOTE — Telephone Encounter (Signed)
CALL IN RX FOR MIRALX TID FOR 3 DAYS THEN ONCE DAILY, #1 BOTTLE, [id number].

## 2012-11-27 NOTE — Telephone Encounter (Signed)
Tried to call pt . Busy.

## 2012-11-30 NOTE — Telephone Encounter (Signed)
Pt aware, got the Rx and is doing some better.

## 2012-12-01 NOTE — Progress Notes (Signed)
REMINDER MADE 

## 2012-12-08 ENCOUNTER — Telehealth: Payer: Self-pay

## 2012-12-08 NOTE — Telephone Encounter (Signed)
Dr. Darrick Penna called and told Walter Herring for me to call pt and tell him to go to the ED if he is having the dizziness. I called and informed pt.

## 2012-12-08 NOTE — Telephone Encounter (Signed)
Pt's wife called and said he has been having some dizziness today and abdominal pain which is not worse than his usual. She took his blood pressure and it was 109/75 and I told her that was about what it was the last OV. She said he just cannot keep his balance when he stands and wants to know what to do. I told her Dr. Darrick Penna is at the hospital and I will send her a message, but if he starts getting worse and can't stand she should go to the ED.   Please advise!

## 2012-12-09 NOTE — Telephone Encounter (Signed)
REVIEWED. AGREE. 

## 2012-12-25 ENCOUNTER — Other Ambulatory Visit: Payer: Self-pay | Admitting: Urgent Care

## 2013-02-09 ENCOUNTER — Other Ambulatory Visit: Payer: Self-pay

## 2013-02-11 MED ORDER — OMEPRAZOLE 20 MG PO CPDR: 20.0000 mg | DELAYED_RELEASE_CAPSULE | Freq: Two times a day (BID) | ORAL | Status: DC

## 2013-02-24 ENCOUNTER — Other Ambulatory Visit: Payer: Self-pay

## 2013-02-24 MED ORDER — LIDOCAINE VISCOUS 2 % MT SOLN: OROMUCOSAL | Status: DC

## 2013-02-24 MED ORDER — PROMETHAZINE HCL 25 MG PO TABS: ORAL_TABLET | ORAL | Status: DC

## 2013-02-27 ENCOUNTER — Emergency Department (HOSPITAL_COMMUNITY): Payer: Medicare Other

## 2013-02-27 ENCOUNTER — Inpatient Hospital Stay (HOSPITAL_COMMUNITY)
Admission: EM | Admit: 2013-02-27 | Discharge: 2013-03-02 | DRG: 392 | Disposition: A | Discharge status: Home / Self Care | Payer: Medicare Other | Attending: Family Medicine | Admitting: Family Medicine

## 2013-02-27 ENCOUNTER — Encounter (HOSPITAL_COMMUNITY): Payer: Self-pay | Admitting: *Deleted

## 2013-02-27 DIAGNOSIS — F411 Generalized anxiety disorder: Secondary | ICD-10-CM | POA: Diagnosis present

## 2013-02-27 DIAGNOSIS — K219 Gastro-esophageal reflux disease without esophagitis: Secondary | ICD-10-CM | POA: Diagnosis present

## 2013-02-27 DIAGNOSIS — R109 Unspecified abdominal pain: Secondary | ICD-10-CM | POA: Diagnosis present

## 2013-02-27 DIAGNOSIS — R1310 Dysphagia, unspecified: Secondary | ICD-10-CM | POA: Diagnosis present

## 2013-02-27 DIAGNOSIS — E785 Hyperlipidemia, unspecified: Secondary | ICD-10-CM | POA: Diagnosis present

## 2013-02-27 DIAGNOSIS — K220 Achalasia of cardia: Secondary | ICD-10-CM | POA: Diagnosis present

## 2013-02-27 DIAGNOSIS — R1013 Epigastric pain: Secondary | ICD-10-CM | POA: Diagnosis present

## 2013-02-27 DIAGNOSIS — Z87891 Personal history of nicotine dependence: Secondary | ICD-10-CM

## 2013-02-27 DIAGNOSIS — F3162 Bipolar disorder, current episode mixed, moderate: Secondary | ICD-10-CM

## 2013-02-27 DIAGNOSIS — J449 Chronic obstructive pulmonary disease, unspecified: Secondary | ICD-10-CM | POA: Diagnosis present

## 2013-02-27 DIAGNOSIS — R0602 Shortness of breath: Secondary | ICD-10-CM | POA: Diagnosis present

## 2013-02-27 DIAGNOSIS — K2100 Gastro-esophageal reflux disease with esophagitis, without bleeding: Principal | ICD-10-CM | POA: Diagnosis present

## 2013-02-27 DIAGNOSIS — I10 Essential (primary) hypertension: Secondary | ICD-10-CM | POA: Diagnosis present

## 2013-02-27 DIAGNOSIS — G8929 Other chronic pain: Secondary | ICD-10-CM | POA: Diagnosis present

## 2013-02-27 DIAGNOSIS — Z801 Family history of malignant neoplasm of trachea, bronchus and lung: Secondary | ICD-10-CM

## 2013-02-27 DIAGNOSIS — K2970 Gastritis, unspecified, without bleeding: Secondary | ICD-10-CM | POA: Diagnosis present

## 2013-02-27 DIAGNOSIS — K922 Gastrointestinal hemorrhage, unspecified: Secondary | ICD-10-CM

## 2013-02-27 DIAGNOSIS — N179 Acute kidney failure, unspecified: Secondary | ICD-10-CM | POA: Diagnosis present

## 2013-02-27 DIAGNOSIS — Z79899 Other long term (current) drug therapy: Secondary | ICD-10-CM

## 2013-02-27 DIAGNOSIS — IMO0002 Reserved for concepts with insufficient information to code with codable children: Secondary | ICD-10-CM | POA: Diagnosis present

## 2013-02-27 DIAGNOSIS — R1319 Other dysphagia: Secondary | ICD-10-CM

## 2013-02-27 DIAGNOSIS — E860 Dehydration: Secondary | ICD-10-CM | POA: Diagnosis present

## 2013-02-27 DIAGNOSIS — J4489 Other specified chronic obstructive pulmonary disease: Secondary | ICD-10-CM | POA: Diagnosis present

## 2013-02-27 DIAGNOSIS — K920 Hematemesis: Secondary | ICD-10-CM | POA: Diagnosis present

## 2013-02-27 DIAGNOSIS — K5900 Constipation, unspecified: Secondary | ICD-10-CM | POA: Diagnosis present

## 2013-02-27 LAB — COMPREHENSIVE METABOLIC PANEL
ALT: 30 U/L (ref 0–53)
Alkaline Phosphatase: 96 U/L (ref 39–117)
BUN: 26 mg/dL — ABNORMAL HIGH (ref 6–23)
CO2: 25 mEq/L (ref 19–32)
Calcium: 9.3 mg/dL (ref 8.4–10.5)
GFR calc Af Amer: 63 mL/min — ABNORMAL LOW (ref >90)
GFR calc non Af Amer: 54 mL/min — ABNORMAL LOW (ref >90)
Glucose, Bld: 141 mg/dL — ABNORMAL HIGH (ref 70–99)
Potassium: 3.7 mEq/L (ref 3.5–5.1)
Sodium: 135 mEq/L (ref 135–145)

## 2013-02-27 LAB — CBC WITH DIFFERENTIAL/PLATELET
Basophils Absolute: 0.1 10*3/uL (ref 0.0–0.1)
Basophils Relative: 1 % (ref 0–1)
Eosinophils Absolute: 0.3 10*3/uL (ref 0.0–0.7)
MCH: 29.8 pg (ref 26.0–34.0)
MCHC: 34 g/dL (ref 30.0–36.0)
Neutro Abs: 4.7 10*3/uL (ref 1.7–7.7)
Neutrophils Relative %: 59 % (ref 43–77)
Platelets: 235 10*3/uL (ref 150–400)

## 2013-02-27 LAB — URINALYSIS, ROUTINE W REFLEX MICROSCOPIC
Ketones, ur: 15 mg/dL — AB
Nitrite: NEGATIVE
Protein, ur: 30 mg/dL — AB
Urobilinogen, UA: 0.2 mg/dL (ref 0.0–1.0)

## 2013-02-27 LAB — URINE MICROSCOPIC-ADD ON

## 2013-02-27 LAB — LIPASE, BLOOD: Lipase: 45 U/L (ref 11–59)

## 2013-02-27 LAB — TROPONIN I: Troponin I: <0.3 ng/mL (ref <0.30)

## 2013-02-27 MED ORDER — SODIUM CHLORIDE 0.9 % IV SOLN
80.0000 mg | Freq: Once | INTRAVENOUS | Status: AC
  Administered 2013-02-27: 80 mg via INTRAVENOUS
  Filled 2013-02-27: qty 80

## 2013-02-27 MED ORDER — IOHEXOL 300 MG/ML  SOLN
50.0000 mL | Freq: Once | INTRAMUSCULAR | Status: AC | PRN
  Administered 2013-02-27: 50 mL via ORAL

## 2013-02-27 MED ORDER — MORPHINE SULFATE 4 MG/ML IJ SOLN
4.0000 mg | Freq: Once | INTRAMUSCULAR | Status: AC
  Administered 2013-02-27: 4 mg via INTRAVENOUS
  Filled 2013-02-27: qty 1

## 2013-02-27 MED ORDER — SODIUM CHLORIDE 0.9 % IV BOLUS (SEPSIS)
1000.0000 mL | Freq: Once | INTRAVENOUS | Status: AC
  Administered 2013-02-27: 1000 mL via INTRAVENOUS

## 2013-02-27 MED ORDER — QUETIAPINE FUMARATE 100 MG PO TABS
400.0000 mg | ORAL_TABLET | Freq: Every day | ORAL | Status: DC
  Administered 2013-02-27 – 2013-03-01 (×3): 400 mg via ORAL
  Filled 2013-02-27 (×3): qty 4

## 2013-02-27 MED ORDER — IOHEXOL 300 MG/ML  SOLN
100.0000 mL | Freq: Once | INTRAMUSCULAR | Status: AC | PRN
  Administered 2013-02-27: 100 mL via INTRAVENOUS

## 2013-02-27 MED ORDER — PANTOPRAZOLE SODIUM 40 MG IV SOLR
40.0000 mg | Freq: Two times a day (BID) | INTRAVENOUS | Status: DC
  Administered 2013-02-27 – 2013-03-02 (×6): 40 mg via INTRAVENOUS
  Filled 2013-02-27 (×6): qty 40

## 2013-02-27 MED ORDER — BISACODYL 10 MG RE SUPP
10.0000 mg | Freq: Every day | RECTAL | Status: DC
  Administered 2013-02-27 – 2013-03-02 (×3): 10 mg via RECTAL
  Filled 2013-02-27 (×3): qty 1

## 2013-02-27 MED ORDER — POLYETHYLENE GLYCOL 3350 17 G PO PACK
17.0000 g | PACK | Freq: Two times a day (BID) | ORAL | Status: DC
  Administered 2013-02-27 – 2013-03-02 (×5): 17 g via ORAL
  Filled 2013-02-27 (×6): qty 1

## 2013-02-27 MED ORDER — ONDANSETRON HCL 4 MG/2ML IJ SOLN
4.0000 mg | Freq: Three times a day (TID) | INTRAMUSCULAR | Status: AC | PRN
  Filled 2013-02-27: qty 2

## 2013-02-27 MED ORDER — ONDANSETRON HCL 4 MG/2ML IJ SOLN
4.0000 mg | Freq: Three times a day (TID) | INTRAMUSCULAR | Status: DC | PRN
Start: 2013-02-27 — End: 2013-03-02
  Administered 2013-02-27 – 2013-02-28 (×2): 4 mg via INTRAVENOUS
  Filled 2013-02-27: qty 2

## 2013-02-27 MED ORDER — DULOXETINE HCL 60 MG PO CPEP
60.0000 mg | ORAL_CAPSULE | Freq: Every day | ORAL | Status: DC
  Administered 2013-02-27 – 2013-03-02 (×4): 60 mg via ORAL
  Filled 2013-02-27 (×4): qty 1

## 2013-02-27 MED ORDER — SODIUM CHLORIDE 0.9 % IV SOLN
INTRAVENOUS | Status: DC
  Administered 2013-02-27 – 2013-03-02 (×6): via INTRAVENOUS

## 2013-02-27 MED ORDER — LORAZEPAM 1 MG PO TABS: 2.0000 mg | ORAL_TABLET | Freq: Every evening | ORAL | Status: DC | PRN

## 2013-02-27 MED ORDER — SIMVASTATIN 10 MG PO TABS
10.0000 mg | ORAL_TABLET | Freq: Every day | ORAL | Status: DC
  Administered 2013-02-27 – 2013-03-01 (×3): 10 mg via ORAL
  Filled 2013-02-27 (×3): qty 1

## 2013-02-27 MED ORDER — SODIUM CHLORIDE 0.9 % IJ SOLN
3.0000 mL | Freq: Two times a day (BID) | INTRAMUSCULAR | Status: DC
  Administered 2013-02-27 – 2013-03-02 (×5): 3 mL via INTRAVENOUS

## 2013-02-27 MED ORDER — FLUTICASONE PROPIONATE 50 MCG/ACT NA SUSP
1.0000 | Freq: Every day | NASAL | Status: DC
  Administered 2013-02-27 – 2013-03-02 (×4): 1 via NASAL
  Filled 2013-02-27: qty 16

## 2013-02-27 MED ORDER — ALBUTEROL SULFATE (5 MG/ML) 0.5% IN NEBU: 2.5000 mg | INHALATION_SOLUTION | Freq: Four times a day (QID) | RESPIRATORY_TRACT | Status: DC | PRN

## 2013-02-27 MED ORDER — FLUTICASONE PROPIONATE 50 MCG/ACT NA SUSP
NASAL | Status: AC
  Filled 2013-02-27: qty 16

## 2013-02-27 MED ORDER — HYDROMORPHONE HCL PF 1 MG/ML IJ SOLN
1.0000 mg | INTRAMUSCULAR | Status: DC | PRN
  Administered 2013-02-27 – 2013-03-02 (×7): 1 mg via INTRAVENOUS
  Filled 2013-02-27 (×7): qty 1

## 2013-02-27 MED ORDER — MORPHINE SULFATE 2 MG/ML IJ SOLN
2.0000 mg | INTRAMUSCULAR | Status: DC | PRN
  Administered 2013-02-27: 2 mg via INTRAVENOUS
  Filled 2013-02-27: qty 1

## 2013-02-27 MED ORDER — ONDANSETRON HCL 4 MG/2ML IJ SOLN
4.0000 mg | Freq: Once | INTRAMUSCULAR | Status: AC
  Administered 2013-02-27: 4 mg via INTRAVENOUS
  Filled 2013-02-27: qty 2

## 2013-02-27 MED ORDER — AMITRIPTYLINE HCL 25 MG PO TABS
75.0000 mg | ORAL_TABLET | Freq: Every day | ORAL | Status: DC
  Administered 2013-02-27 – 2013-03-01 (×3): 75 mg via ORAL
  Filled 2013-02-27 (×3): qty 3

## 2013-02-27 NOTE — ED Provider Notes (Signed)
History    CSN: [id number] Arrival date & time 02/27/13  0865  First MD Initiated Contact with Patient 02/27/13 1006     Chief Complaint  Patient presents with  . Abdominal Pain   (Consider location/radiation/quality/duration/timing/severity/associated sxs/prior Treatment) HPI Comments: Walter Herring is a 57 y.o. Male presenting with mid abdominal pain which is sharp,  Waxing and waning and severe at times, radiating into his chest.  He also describes shortness of breath and increased abdominal pain with deep inspiration.  He has a history of achalasia and also GERD,  And is on daily prilosec for this condition.  He had a Heller myotomy procedure in 2008.   He has a history of chronic intermittent abdominal pain but states this pain today is worse and different from his normal chronic pain.  He has had very little po intake in 3 days,  As this makes his pain worse.  He has had nausea without emesis,  But does report frequently spitting up dark bits mixed with his saliva.  He does have a history of constipation,  His last bm was 3 days ago.  He denies blood in stool and also denies fevers and chills. He describes debilitating fatigue and intermittent episodes of lightheadedness which he reports has been present for at least the past year.  He has been told he has "Low T",  But to date his androgel supplement is not relieving this symptom.       The history is provided by the patient and the spouse.   Past Medical History  Diagnosis Date  . DDD (degenerative disc disease)   . GERD (gastroesophageal reflux disease)   . COPD (chronic obstructive pulmonary disease)   . Hypercholesterolemia   . Anxiety   . Achalasia 2008 168 LBS  . Chronic abdominal pain     AUG 2010 CT ABD W/ IVC-NL PANCREAS, GB, LIVER  . Hypercholesteremia   . Dysphagia    Past Surgical History  Procedure Laterality Date  . Heller myotomy  DEC 2008 DR. CARL WESTCOTT  . Esophagogastroduodenoscopy       MAR 2012 (RMR)  TTS DIL 20 MM, JAN 2010 Gastritis  . Hemorrhoid surgery    . Colonoscopy  2009 [facility]    Normal  . Savory dilation  05/03/2011    Stricture/mild gastritis   Family History  Problem Relation Age of Onset  . Colon cancer Neg Hx   . Colon polyps Neg Hx   . Anesthesia problems Neg Hx   . Hypotension Neg Hx   . Malignant hyperthermia Neg Hx   . Pseudochol deficiency Neg Hx    History  Substance Use Topics  . Smoking status: Former Smoker -- 1.00 packs/day    Types: Cigarettes    Quit date: 08/27/1995  . Smokeless tobacco: Current User    Types: Chew  . Alcohol Use: No    Review of Systems  Constitutional: Positive for fatigue. Negative for fever and chills.  HENT: Negative for congestion, sore throat and neck pain.   Eyes: Negative.   Respiratory: Negative for chest tightness and shortness of breath.   Cardiovascular: Negative for chest pain.  Gastrointestinal: Positive for abdominal pain and constipation. Negative for nausea, vomiting and blood in stool.  Genitourinary: Negative.   Musculoskeletal: Negative for joint swelling and arthralgias.  Skin: Negative.  Negative for rash and wound.  Neurological: Positive for light-headedness. Negative for dizziness, weakness, numbness and headaches.  Psychiatric/Behavioral: Negative.     Allergies  Dairy aid  Home Medications   Current Outpatient Rx  Name  Route  Sig  Dispense  Refill  . albuterol (PROVENTIL) (2.5 MG/3ML) 0.083% nebulizer solution   Nebulization   Take 2.5 mg by nebulization every 6 (six) hours as needed for shortness of breath.          Marland Kitchen amitriptyline (ELAVIL) 75 MG tablet   Oral   Take 75 mg by mouth at bedtime.         . DULoxetine (CYMBALTA) 60 MG capsule   Oral   Take 60 mg by mouth daily.           . fluticasone (FLONASE) 50 MCG/ACT nasal spray   Nasal   Place 1 spray into the nose daily as needed.          Marland Kitchen LORazepam (ATIVAN) 2 MG tablet   Oral   Take 2 mg by mouth at bedtime as needed  (sleep).         . Multiple Vitamins-Minerals (MULTIVITAMINS THER. W/MINERALS) TABS   Oral   Take 1 tablet by mouth daily.         Marland Kitchen omeprazole (PRILOSEC) 20 MG capsule   Oral   Take 1 capsule (20 mg total) by mouth 2 (two) times daily before a meal.   60 capsule   11   . oxyCODONE-acetaminophen (PERCOCET) 7.5-325 MG per tablet   Oral   Take 1 tablet by mouth every 6 (six) hours as needed for pain.          . polyethylene glycol (MIRALAX / GLYCOLAX) packet   Oral   Take 17 g by mouth daily as needed (constipation).         . pravastatin (PRAVACHOL) 40 MG tablet   Oral   Take 40 mg by mouth daily.          . promethazine (PHENERGAN) 25 MG tablet      TAKE ONE TABLET BY MOUTH EVERY 6 HOURS AS NEEDED FOR  NAUSEA   30 tablet   0   . QUEtiapine (SEROQUEL) 400 MG tablet   Oral   Take 400 mg by mouth at bedtime.          Marland Kitchen testosterone (ANDROGEL) 50 MG/5GM GEL   Transdermal   Place 5 g onto the skin daily.          BP 102/86  Pulse 112  Temp(Src) 98.7 F (37.1 C) (Oral)  Resp 17  Ht 6\' 1"  (1.854 m)  Wt 210 lb (95.255 kg)  BMI 27.71 kg/m2  SpO2 93% Physical Exam  Nursing note and vitals reviewed. Constitutional: He appears well-developed and well-nourished. He appears distressed.  Patient very anxious,  Diaphoretic,  Hyperventilating upon first exam.  HENT:  Head: Normocephalic and atraumatic.  Eyes: Conjunctivae are normal.  Neck: Normal range of motion. Neck supple.  Cardiovascular: Regular rhythm, normal heart sounds and intact distal pulses.   Pulmonary/Chest: Effort normal and breath sounds normal. He has no wheezes.  Abdominal: Soft. He exhibits no distension, no fluid wave, no abdominal bruit and no mass. Bowel sounds are decreased. There is no hepatosplenomegaly. There is tenderness in the epigastric area and periumbilical area. There is no rebound, no guarding and no CVA tenderness.  Musculoskeletal: Normal range of motion.  Neurological: He  is alert.  Skin: Skin is warm. He is diaphoretic.  Psychiatric: He has a normal mood and affect.    ED Course  Procedures (including critical care time) Labs Reviewed  COMPREHENSIVE METABOLIC PANEL - Abnormal; Notable for the following:    Glucose, Bld 141 (*)    BUN 26 (*)    Creatinine, Ser 1.38 (*)    GFR calc non Af Amer 54 (*)    GFR calc Af Amer 63 (*)    All other components within normal limits  URINALYSIS, ROUTINE W REFLEX MICROSCOPIC - Abnormal; Notable for the following:    Specific Gravity, Urine >1.030 (*)    Bilirubin Urine MODERATE (*)    Ketones, ur 15 (*)    Protein, ur 30 (*)    Leukocytes, UA TRACE (*)    All other components within normal limits  URINE MICROSCOPIC-ADD ON - Abnormal; Notable for the following:    Bacteria, UA FEW (*)    All other components within normal limits  URINE CULTURE  LIPASE, BLOOD  CBC WITH DIFFERENTIAL  TROPONIN I   Ct Abdomen Pelvis W Contrast  02/27/2013   *RADIOLOGY REPORT*  Clinical Data: Mid abdominal pain.  Nausea and vomiting.  CT ABDOMEN AND PELVIS WITH CONTRAST  Technique:  Multidetector CT imaging of the abdomen and pelvis was performed following the standard protocol during bolus administration of intravenous contrast.  Contrast: 50mL OMNIPAQUE IOHEXOL 300 MG/ML  SOLN, OMNIPAQUE IOHEXOL 300 MG/ML  SOLN  Comparison: 11/26/2012  Findings: Moderate sized hiatal hernia noted.  0.8 cm short axis lymph node adjacent to the stomach just distal to the hiatus. Upper normal sized porta hepatis lymph nodes noted.  Diffuse hepatic steatosis.  The spleen, pancreas, and adrenal glands appear normal.  The gallbladder and biliary system appear unremarkable.  Several tiny left renal cysts are similar to the prior exam from 08/04/2007.  Left mid kidney and 0.5 x 0.3 cm nonobstructive calculus noted.  Linear 4 mm calculus in the left kidney lower pole.  No ureteral or bladder calculus seen.  Aortoiliac atherosclerotic calcification is  present.  Appendix unremarkable.  Urinary bladder normal.  Mildly prominent prostate gland.  IMPRESSION: 1.  Hiatal hernia with potential dilated distal esophagus, query achalasia. Appearance similar to prior CT scan from 2008.  Upper normal sized upper abdominal lymph nodes. 2.  Diffuse fatty steatosis. 3.  Nonobstructive left nephrolithiasis. 4.  Atherosclerosis. 5.  Stable mild prominence the prostate gland.   Original Report Authenticated By: Gaylyn Rong, M.D.   Dg Chest Port 1 View  02/27/2013   *RADIOLOGY REPORT*  Clinical Data: Short of breath and abdominal pain  PORTABLE CHEST - 1 VIEW  Comparison: 11/26/2012  Findings: The heart size and mediastinal contours are within normal limits.  Both lungs are clear.  The visualized skeletal structures are unremarkable.  IMPRESSION: No active cardiopulmonary abnormalities.   Original Report Authenticated By: Signa Kell, M.D.   1. GI bleeding   2. Abdominal pain      gastroccult strongly positive for blood.  Rectal exam with trace heme + mucoid residue.    Date: 02/27/2013  Rate: 132  Rhythm: sinus tachycardia  QRS Axis: right  Intervals: normal  ST/T Wave abnormalities: nonspecific ST changes  Conduction Disutrbances:none  Narrative Interpretation:   Old EKG Reviewed: changes noted  Pt was given morphine 4 mg IV,  Zofran.  Complete resolution of pain with improved pulse and respiratory rate.  He was given IV fluids during ed stay given h/o reduced PO intake.      MDM  Patients labs and/or radiological studies were viewed and considered during the medical decision making and disposition process.  Call to [facility]  for admission.  Temp admission orders given.    Burgess Amor, PA-C 02/27/13 1633  Burgess Amor, PA-C 02/27/13 [phone]

## 2013-02-27 NOTE — Plan of Care (Signed)
Paged Dr. Elvera Lennox to inform that pt has arrived in room 220.

## 2013-02-27 NOTE — ED Notes (Signed)
MD at bedside. Admitting hospitalist in room at this time

## 2013-02-27 NOTE — H&P (Signed)
[facility] History and Physical  Walter Herring:[id number] DOB: Oct 29, 1955 DOA: 02/27/2013  Referring physician: Dr. Clarene Duke PCP: Isabella Stalling, MD  Specialists: none  Chief Complaint: Abdominal pain  HPI: Walter Herring is a 56 y.o. male has a past medical history significant for GERD, chronic epigastric pain, chronic gastritis, anxiety, achalasia status post Heller myotomy in 2008, presents today with a chief complaint of epigastric and lower abdominal pain, "bandlike". His pain waxing and waning, and is very different from his chronic abdominal pain. He's never had pain like this in the past. For the last 3 days, his pain has been so severe that he was unable to eat. She also complains of nausea and emesis with coffee-ground material. He denies any diarrhea, last bowel movement was 3 days ago, he denies constipation, states that this is because he hasn't eaten anything. He denies any fever or chills. He denies any upper chest pain. He also complains of having worsening fatigue and shortness of breath with minimal exertion, progressive in the last 2 months, now he feels short of breath even if he walks a small distance. He denies any orthopnea. He denies any leg swelling. He denies any weight gain, thinks he lost some weight in the last 2 days because he hasn't been eating. He denies any increased abdominal girth. In the emergency room, patient underwent a CT scan which showed a similar appearance to prior CT in 2008 (for full report please see below). He has mild AKI, LFTs are normal, lipase is normal. [facility] has been asked to admit for intractable abdominal pain with poor by mouth intake. On emesis sample in the ED was occult positive, as well as FOBT was positive.  Review of Systems: As per history of present illness, otherwise negative.  Past Medical History  Diagnosis Date  . DDD (degenerative disc disease)   . GERD (gastroesophageal reflux disease)   . COPD (chronic  obstructive pulmonary disease)   . Hypercholesterolemia   . Anxiety   . Achalasia 2008 168 LBS  . Chronic abdominal pain     AUG 2010 CT ABD W/ IVC-NL PANCREAS, GB, LIVER  . Hypercholesteremia   . Dysphagia    Past Surgical History  Procedure Laterality Date  . Heller myotomy  DEC 2008 DR. CARL WESTCOTT  . Esophagogastroduodenoscopy       MAR 2012 ([facility]) TTS DIL 20 MM, JAN 2010 Gastritis  . Hemorrhoid surgery    . Colonoscopy  2009 [facility]    Normal  . Savory dilation  05/03/2011    Stricture/mild gastritis   Social History:  reports that he quit smoking about 17 years ago. His smoking use included Cigarettes. He smoked 1.00 pack per day. His smokeless tobacco use includes Chew. He reports that he does not drink alcohol or use illicit drugs.  Allergies  Allergen Reactions  . Dairy Aid (Lactase) Nausea And Vomiting    Family History  Problem Relation Age of Onset  . Colon cancer Neg Hx   . Colon polyps Neg Hx   . Anesthesia problems Neg Hx   . Hypotension Neg Hx   . Malignant hyperthermia Neg Hx   . Pseudochol deficiency Neg Hx    Prior to Admission medications   Medication Sig Start Date End Date Taking? Authorizing Provider  albuterol (PROVENTIL) (2.5 MG/3ML) 0.083% nebulizer solution Take 2.5 mg by nebulization every 6 (six) hours as needed for shortness of breath.    Yes Historical Provider, MD  amitriptyline (ELAVIL) 75 MG  tablet Take 75 mg by mouth at bedtime.   Yes Historical Provider, MD  DULoxetine (CYMBALTA) 60 MG capsule Take 60 mg by mouth daily.     Yes Historical Provider, MD  fluticasone (FLONASE) 50 MCG/ACT nasal spray Place 1 spray into the nose daily as needed.  11/24/12  Yes Historical Provider, MD  LORazepam (ATIVAN) 2 MG tablet Take 2 mg by mouth at bedtime as needed (sleep).   Yes Historical Provider, MD  Multiple Vitamins-Minerals (MULTIVITAMINS THER. W/MINERALS) TABS Take 1 tablet by mouth daily.   Yes Historical Provider, MD  omeprazole (PRILOSEC) 20 MG  capsule Take 1 capsule (20 mg total) by mouth 2 (two) times daily before a meal. 02/09/13  Yes Tiffany Kocher, PA-C  oxyCODONE-acetaminophen (PERCOCET) 7.5-325 MG per tablet Take 1 tablet by mouth every 6 (six) hours as needed for pain.  10/10/11  Yes Historical Provider, MD  polyethylene glycol (MIRALAX / GLYCOLAX) packet Take 17 g by mouth daily as needed (constipation).   Yes Historical Provider, MD  pravastatin (PRAVACHOL) 40 MG tablet Take 40 mg by mouth daily.    Yes Historical Provider, MD  promethazine (PHENERGAN) 25 MG tablet TAKE ONE TABLET BY MOUTH EVERY 6 HOURS AS NEEDED FOR  NAUSEA 02/24/13  Yes Tiffany Kocher, PA-C  QUEtiapine (SEROQUEL) 400 MG tablet Take 400 mg by mouth at bedtime.  11/24/12  Yes Historical Provider, MD  testosterone (ANDROGEL) 50 MG/5GM GEL Place 5 g onto the skin daily.   Yes Historical Provider, MD   Physical Exam: Filed Vitals:   02/27/13 1500 02/27/13 1538 02/27/13 1548 02/27/13 1600  BP: 90/57 103/69  102/86  Pulse: 117  115 112  Temp:      TempSrc:      Resp:      Height:      Weight:      SpO2: 94%  95% 93%     General:  No apparent distress, very very anxious  Eyes: PERRL, EOMI, no scleral icterus  ENT: moist oropharynx  Neck: supple, no JVD  Cardiovascular: regular rate without MRG; 2+ peripheral pulses  Respiratory: CTA biL, good air movement without wheezing, rhonchi or crackled  Abdomen: soft, mildly tender to palpation bilateral lower quadrants, positive bowel sounds, no guarding, no rebound  Skin: no rashes  Musculoskeletal: no peripheral edema  Neurologic: CN 2-12 grossly intact, MS 5/5 in all 4  Labs on Admission:  Basic Metabolic Panel:  Recent Labs Lab 02/27/13 1032  NA 135  K 3.7  CL 96  CO2 25  GLUCOSE 141*  BUN 26*  CREATININE 1.38*  CALCIUM 9.3   Liver Function Tests:  Recent Labs Lab 02/27/13 1032  AST 32  ALT 30  ALKPHOS 96  BILITOT 1.0  PROT 8.3  ALBUMIN 3.9    Recent Labs Lab 02/27/13 1032   LIPASE 45   CBC:  Recent Labs Lab 02/27/13 1032  WBC 8.0  NEUTROABS 4.7  HGB 15.7  HCT 46.2  MCV 87.8  PLT 235   Cardiac Enzymes:  Recent Labs Lab 02/27/13 1032  TROPONINI <0.30    Radiological Exams on Admission: Ct Abdomen Pelvis W Contrast  02/27/2013   *RADIOLOGY REPORT*  Clinical Data: Mid abdominal pain.  Nausea and vomiting.  CT ABDOMEN AND PELVIS WITH CONTRAST  Technique:  Multidetector CT imaging of the abdomen and pelvis was performed following the standard protocol during bolus administration of intravenous contrast.  Contrast: 50mL OMNIPAQUE IOHEXOL 300 MG/ML  SOLN, OMNIPAQUE IOHEXOL 300  MG/ML  SOLN  Comparison: 11/26/2012  Findings: Moderate sized hiatal hernia noted.  0.8 cm short axis lymph node adjacent to the stomach just distal to the hiatus. Upper normal sized porta hepatis lymph nodes noted.  Diffuse hepatic steatosis.  The spleen, pancreas, and adrenal glands appear normal.  The gallbladder and biliary system appear unremarkable.  Several tiny left renal cysts are similar to the prior exam from 08/04/2007.  Left mid kidney and 0.5 x 0.3 cm nonobstructive calculus noted.  Linear 4 mm calculus in the left kidney lower pole.  No ureteral or bladder calculus seen.  Aortoiliac atherosclerotic calcification is present.  Appendix unremarkable.  Urinary bladder normal.  Mildly prominent prostate gland.  IMPRESSION: 1.  Hiatal hernia with potential dilated distal esophagus, query achalasia. Appearance similar to prior CT scan from 2008.  Upper normal sized upper abdominal lymph nodes. 2.  Diffuse fatty steatosis. 3.  Nonobstructive left nephrolithiasis. 4.  Atherosclerosis. 5.  Stable mild prominence the prostate gland.   Original Report Authenticated By: Gaylyn Rong, M.D.   Dg Chest Port 1 View  02/27/2013   *RADIOLOGY REPORT*  Clinical Data: Short of breath and abdominal pain  PORTABLE CHEST - 1 VIEW  Comparison: 11/26/2012  Findings: The heart size and  mediastinal contours are within normal limits.  Both lungs are clear.  The visualized skeletal structures are unremarkable.  IMPRESSION: No active cardiopulmonary abnormalities.   Original Report Authenticated By: Signa Kell, M.D.    EKG: Independently reviewed.  Assessment/Plan Active Problems:   GASTROESOPHAGEAL REFLUX DISEASE   EPIGASTRIC PAIN   ABDOMINAL PAIN, UPPER/PERIUMBILICAL   Achalasia   Constipation   Shortness of breath on exertion   Abdominal pain - Unclear etiology at this point, it is somewhat difficult to discern what is new versus his chronic abdominal pain. He definitely has positivity on the occult blood testing. We'll start IV PPI 40 mg every 12 hours. Encourage bowel regimen given that some degree of constipation may contribute to his discomfort.  - Keep n.p.o., monitor for bleeding with CBC in the morning - His chronic achalasia seems stable per CT scan. - Few bacteria in his urine, no symptoms - Consider gastroenterology consult in the morning. - CT scan does mention a degree of atherosclerosis in the abdominal vessels, although his symptoms are not very typical of ischemia, its in the differential.  Progressive dyspnea on exertion - Will get 2-D echo  Acute kidney injury - likely related to dehydration, IV fluids and repeat BMP in the morning.  Anxiety - Continue home medications, pharmacy has reconciled medications with his wife.   DVT Prophylaxis  - SCDs  Code Status: presumed full  Family Communication: wife  Disposition Plan: home when medically ready  Time spent: 2  Tannen Vandezande M. Elvera Lennox, MD [facility] Pager [phone]  If 7PM-7AM, please contact night-coverage [email or web address] Password [facility] 02/27/2013, 4:28 PM

## 2013-02-27 NOTE — ED Notes (Signed)
Mid abd pain radiating into chest x 3 days with decreased PO intake.  Pt c/o sob with hyperventilation upon arrival.

## 2013-02-28 LAB — URINE CULTURE: Colony Count: 25000

## 2013-02-28 LAB — COMPREHENSIVE METABOLIC PANEL
AST: 28 U/L (ref 0–37)
Albumin: 3 g/dL — ABNORMAL LOW (ref 3.5–5.2)
Alkaline Phosphatase: 71 U/L (ref 39–117)
BUN: 24 mg/dL — ABNORMAL HIGH (ref 6–23)
Chloride: 100 mEq/L (ref 96–112)
Creatinine, Ser: 1.39 mg/dL — ABNORMAL HIGH (ref 0.50–1.35)
Potassium: 4.1 mEq/L (ref 3.5–5.1)
Total Bilirubin: 0.6 mg/dL (ref 0.3–1.2)
Total Protein: 6.3 g/dL (ref 6.0–8.3)

## 2013-02-28 LAB — CBC
HCT: 38.6 % — ABNORMAL LOW (ref 39.0–52.0)
MCH: 29.3 pg (ref 26.0–34.0)
MCV: 90.6 fL (ref 78.0–100.0)
RBC: 4.26 MIL/uL (ref 4.22–5.81)
WBC: 5.1 10*3/uL (ref 4.0–10.5)

## 2013-02-28 LAB — LIPASE, BLOOD: Lipase: 37 U/L (ref 11–59)

## 2013-02-28 NOTE — Progress Notes (Signed)
[id number] 

## 2013-02-28 NOTE — Progress Notes (Signed)
NAME:  Walter Herring, Walter Herring                ACCOUNT NO.:  [id number]  MEDICAL RECORD NO.:  [id number]  LOCATION:  A220                          FACILITY:  [facility]  PHYSICIAN:  Melvyn Novas, MDDATE OF BIRTH:  [birth year]  DATE OF PROCEDURE: DATE OF DISCHARGE:                                PROGRESS NOTE   The patient is 57 year old white male with a high-degree of anxiety and bipolarity, history of hypertension, hyperlipidemia, who complained of some severe epigastric pain over the preceding several days and was seen in consult in the ER, had a CT scan done of the chest and abdomen, which revealed GERD as well as suggestion of achalasia.  He had a history of gastric myotomy done in 2008 at [place].  Today, his pain is much better.  He was placed on clear liquids today, PPI q.12 hours, and he appears hemodynamically stable.  Hemoglobin 12.5, creatinine 1.39.  He likewise has hepatic steatosis diagnosed by CT scan.  PHYSICAL EXAMINATION:  VITAL SIGNS:  Blood pressure 105/68, temperature 97.6, pulse is 90 and regular, respiratory rate is 20. LUNGS:  Clear to A and P.  No rales, wheeze, or rhonchi. HEART:  Regular rhythm.  No murmurs, gallops, or rubs. EPIGASTRIC:  Reveals no tenderness today and no right upper quadrant tenderness.  No guarding or rebound.  His LFTs were within normal parameters.  PLAN:  Right now is to get lipase, clear liquid diet, PPI q.12h., obtain GI consultation.  The patient is very systematically oriented in many areas, and we will continue Seroquel 400 at bedtime.     Melvyn Novas, MD     RMD/MEDQ  D:  02/28/2013  T:  02/28/2013  Job:  [id number]

## 2013-03-01 ENCOUNTER — Encounter (HOSPITAL_COMMUNITY)
Admission: EM | Disposition: A | Discharge status: Home / Self Care | Payer: Self-pay | Source: Home / Self Care | Attending: Family Medicine

## 2013-03-01 ENCOUNTER — Encounter (HOSPITAL_COMMUNITY): Payer: Self-pay | Admitting: *Deleted

## 2013-03-01 DIAGNOSIS — D649 Anemia, unspecified: Secondary | ICD-10-CM

## 2013-03-01 DIAGNOSIS — K921 Melena: Secondary | ICD-10-CM

## 2013-03-01 DIAGNOSIS — K2960 Other gastritis without bleeding: Secondary | ICD-10-CM

## 2013-03-01 DIAGNOSIS — K208 Other esophagitis: Secondary | ICD-10-CM

## 2013-03-01 DIAGNOSIS — I517 Cardiomegaly: Secondary | ICD-10-CM

## 2013-03-01 HISTORY — PX: ESOPHAGOGASTRODUODENOSCOPY: SHX5428

## 2013-03-01 LAB — BASIC METABOLIC PANEL
CO2: 29 mEq/L (ref 19–32)
Calcium: 8.4 mg/dL (ref 8.4–10.5)
Chloride: 104 mEq/L (ref 96–112)
Creatinine, Ser: 1.14 mg/dL (ref 0.50–1.35)
Glucose, Bld: 81 mg/dL (ref 70–99)

## 2013-03-01 LAB — CBC
HCT: 34.6 % — ABNORMAL LOW (ref 39.0–52.0)
MCH: 29.4 pg (ref 26.0–34.0)
MCV: 89.9 fL (ref 78.0–100.0)
RDW: 13.7 % (ref 11.5–15.5)
WBC: 4.2 10*3/uL (ref 4.0–10.5)

## 2013-03-01 SURGERY — EGD (ESOPHAGOGASTRODUODENOSCOPY)
Anesthesia: Moderate Sedation

## 2013-03-01 MED ORDER — MEPERIDINE HCL 50 MG/ML IJ SOLN
INTRAMUSCULAR | Status: AC
  Filled 2013-03-01: qty 1

## 2013-03-01 MED ORDER — SUCRALFATE 1 GM/10ML PO SUSP
1.0000 g | Freq: Three times a day (TID) | ORAL | Status: DC
  Administered 2013-03-01 – 2013-03-02 (×5): 1 g via ORAL
  Filled 2013-03-01 (×5): qty 10

## 2013-03-01 MED ORDER — BUTAMBEN-TETRACAINE-BENZOCAINE 2-2-14 % EX AERO
INHALATION_SPRAY | CUTANEOUS | Status: DC | PRN
  Administered 2013-03-01: 2 via TOPICAL

## 2013-03-01 MED ORDER — MEPERIDINE HCL 25 MG/ML IJ SOLN
INTRAMUSCULAR | Status: DC | PRN
  Administered 2013-03-01 (×2): 25 mg via INTRAVENOUS

## 2013-03-01 MED ORDER — MIDAZOLAM HCL 5 MG/5ML IJ SOLN
INTRAMUSCULAR | Status: DC | PRN
  Administered 2013-03-01: 3 mg via INTRAVENOUS
  Administered 2013-03-01: 2 mg via INTRAVENOUS

## 2013-03-01 MED ORDER — SODIUM CHLORIDE 0.9 % IV SOLN: INTRAVENOUS | Status: DC

## 2013-03-01 MED ORDER — MIDAZOLAM HCL 5 MG/5ML IJ SOLN
INTRAMUSCULAR | Status: AC
  Filled 2013-03-01: qty 10

## 2013-03-01 NOTE — Care Management Note (Signed)
    Page 1 of 1   03/02/2013     1:14:44 PM   CARE MANAGEMENT NOTE 03/02/2013  Patient:  Walter Herring, Walter Herring   Account Number:  [id number]  Date Initiated:  03/01/2013  Documentation initiated by:  Sharrie Rothman  Subjective/Objective Assessment:   Pt admitted from home with abd pain. Pt lives with his wife and will return home at discharge. Pt is independent with ADL's.     Action/Plan:   No CM needs noted.   Anticipated DC Date:  03/02/2013   Anticipated DC Plan:  HOME/SELF CARE      DC Planning Services  CM consult      Choice offered to / List presented to:             Status of service:  Completed, signed off Medicare Important Message given?  YES (If response is "NO", the following Medicare IM given date fields will be blank) Date Medicare IM given:  03/02/2013 Date Additional Medicare IM given:    Discharge Disposition:  HOME/SELF CARE  Per UR Regulation:    If discussed at Long Length of Stay Meetings, dates discussed:    Comments:  03/02/13 1315 Arlyss Queen, RN BSN CM Pt discharged home today. No CM needs noted.  03/01/13 1135 Arlyss Queen, RN BSN CM

## 2013-03-01 NOTE — ED Provider Notes (Signed)
Medical screening examination/treatment/procedure(s) were performed by non-physician practitioner and as supervising physician I was immediately available for consultation/collaboration.   Rethel Sebek M Jaivon Vanbeek, DO 03/01/13 0723 

## 2013-03-01 NOTE — Progress Notes (Signed)
[id number] 

## 2013-03-01 NOTE — Progress Notes (Signed)
UR chart review completed.  

## 2013-03-01 NOTE — Consult Note (Signed)
Referring Provider: No ref. provider found Primary Care Physician:  Isabella Stalling, MD Primary Gastroenterologist:  Dr. Darrick Penna  Reason for Consultation:  Upper GI and abdominal pain.  HPI: Patient is 57 year old Caucasian male with history of achalasia status post Heller's myotomy in December 2009, chronic abdominal pain and anxiety who was admitted to Dr. Delbert Harness service yesterday via emergency room where he presented with 3-4 week history of upper and mid abdominal pain and intermittent coffee-ground emesis. In ER vomitus was was heme positive and so was stool. He states he has not felt well for the last few weeks. He has noted postprandial epigastric and mid abdominal pain which has been intermittent and made worse with meals. He also has experienced nausea and few episodes of coffee-ground emesis and yesterday he had 2 large tarry stools. He also complains of profound weakness. He is not sure if he has lost any weight. He denies fever or chills. He also denies hematuria or dysuria. He has intermittent dysphagia with solids but has not been progressive. He was reevaluated and a redo Heller's myotomy was advised but patient declined. He states his heartburn is well controlled with PPI. He does not take OTC NSAIDs. His last EGD EGD was in September 2012 by Dr. Stoney Bang when his esophagus was dilated to 17 mm with Savary dilator. He had barium study and November 2012 revealing dilated body of the esophagus with distal narrowing and findings felt to be consistent with achalasia. Barium pill did not pass into the stomach. Patient states he's had problems with anxiety for several years. Review of the systems also positive for chronic low back pain. Patient quit cigarette smoking in 1998 and now chews tobacco. He does not drink alcohol. He is married and has one biologic and one stepchild. 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 He states he works close to 40  years. Patient states his father died of lung carcinoma in his 20s. 2 brothers are deceased. He desires not to go into further details of his family history.  Past Medical History  Diagnosis Date  . DDD (degenerative disc disease)   . GERD (gastroesophageal reflux disease)   . COPD (chronic obstructive pulmonary disease)   . Hypercholesterolemia   . Anxiety   . Achalasia 2008 168 LBS  . Chronic abdominal pain     AUG 2010 CT ABD W/ IVC-NL PANCREAS, GB, LIVER  . Hypercholesteremia   . Dysphagia     Past Surgical History  Procedure Laterality Date  . Heller myotomy  DEC 2008 DR. CARL WESTCOTT  . Esophagogastroduodenoscopy       MAR 2012 (RMR) TTS DIL 20 MM, JAN 2010 Gastritis  . Hemorrhoid surgery    . Colonoscopy  2009 [facility]    Normal  . Savory dilation  05/03/2011    Stricture/mild gastritis    Prior to Admission medications   Medication Sig Start Date End Date Taking? Authorizing Provider  albuterol (PROVENTIL) (2.5 MG/3ML) 0.083% nebulizer solution Take 2.5 mg by nebulization every 6 (six) hours as needed for shortness of breath.    Yes Historical Provider, MD  amitriptyline (ELAVIL) 75 MG tablet Take 75 mg by mouth at bedtime.   Yes Historical Provider, MD  DULoxetine (CYMBALTA) 60 MG capsule Take 60 mg by mouth daily.     Yes Historical Provider, MD  fluticasone (FLONASE) 50 MCG/ACT nasal spray Place 1 spray into the nose daily as needed.  11/24/12  Yes Historical  Provider, MD  LORazepam (ATIVAN) 2 MG tablet Take 2 mg by mouth at bedtime as needed (sleep).   Yes Historical Provider, MD  Multiple Vitamins-Minerals (MULTIVITAMINS THER. W/MINERALS) TABS Take 1 tablet by mouth daily.   Yes Historical Provider, MD  omeprazole (PRILOSEC) 20 MG capsule Take 1 capsule (20 mg total) by mouth 2 (two) times daily before a meal. 02/09/13  Yes Tiffany Kocher, PA-C  oxyCODONE-acetaminophen (PERCOCET) 7.5-325 MG per tablet Take 1 tablet by mouth every 6 (six) hours as needed for pain.  10/10/11   Yes Historical Provider, MD  polyethylene glycol (MIRALAX / GLYCOLAX) packet Take 17 g by mouth daily as needed (constipation).   Yes Historical Provider, MD  pravastatin (PRAVACHOL) 40 MG tablet Take 40 mg by mouth daily.    Yes Historical Provider, MD  promethazine (PHENERGAN) 25 MG tablet TAKE ONE TABLET BY MOUTH EVERY 6 HOURS AS NEEDED FOR  NAUSEA 02/24/13  Yes Tiffany Kocher, PA-C  QUEtiapine (SEROQUEL) 400 MG tablet Take 400 mg by mouth at bedtime.  11/24/12  Yes Historical Provider, MD  testosterone (ANDROGEL) 50 MG/5GM GEL Place 5 g onto the skin daily.   Yes Historical Provider, MD    Current Facility-Administered Medications  Medication Dose Route Frequency Provider Last Rate Last Dose  . 0.9 %  sodium chloride infusion   Intravenous Continuous Pamella Pert, MD 75 mL/hr at 03/01/13 0932    . albuterol (PROVENTIL) (5 MG/ML) 0.5% nebulizer solution 2.5 mg  2.5 mg Nebulization Q6H PRN Pamella Pert, MD      . amitriptyline (ELAVIL) tablet 75 mg  75 mg Oral QHS Pamella Pert, MD   75 mg at 02/28/13 2101  . bisacodyl (DULCOLAX) suppository 10 mg  10 mg Rectal Daily Pamella Pert, MD   10 mg at 02/28/13 0942  . DULoxetine (CYMBALTA) DR capsule 60 mg  60 mg Oral Daily Pamella Pert, MD   60 mg at 03/01/13 0933  . fluticasone (FLONASE) 50 MCG/ACT nasal spray 1 spray  1 spray Each Nare Daily Pamella Pert, MD   1 spray at 03/01/13 0933  . HYDROmorphone (DILAUDID) injection 1 mg  1 mg Intravenous Q4H PRN Meredeth Ide, MD   1 mg at 02/28/13 2009  . LORazepam (ATIVAN) tablet 2 mg  2 mg Oral QHS PRN Pamella Pert, MD      . ondansetron (ZOFRAN) injection 4 mg  4 mg Intravenous Q8H PRN Pamella Pert, MD   4 mg at 02/28/13 1739  . pantoprazole (PROTONIX) injection 40 mg  40 mg Intravenous Q12H Pamella Pert, MD   40 mg at 03/01/13 0933  . polyethylene glycol (MIRALAX / GLYCOLAX) packet 17 g  17 g Oral BID Pamella Pert, MD   17 g at 02/28/13 2101  . QUEtiapine (SEROQUEL) tablet 400 mg  400 mg  Oral QHS Pamella Pert, MD   400 mg at 02/28/13 2101  . simvastatin (ZOCOR) tablet 10 mg  10 mg Oral q1800 Pamella Pert, MD   10 mg at 02/28/13 1728  . sodium chloride 0.9 % injection 3 mL  3 mL Intravenous Q12H Pamella Pert, MD   3 mL at 03/01/13 0933    Allergies as of 02/27/2013 - Review Complete 02/27/2013  Allergen Reaction Noted  . Dairy aid (lactase) Nausea And Vomiting 12/06/2010    Family History  Problem Relation Age of Onset  . Colon cancer Neg Hx   . Colon polyps Neg Hx   . Anesthesia problems Neg Hx   .  Hypotension Neg Hx   . Malignant hyperthermia Neg Hx   . Pseudochol deficiency Neg Hx     History   Social History  . Marital Status: Married    Spouse Name: N/A    Number of Children: N/A  . Years of Education: N/A   Occupational History  . Not on file.   Social History Main Topics  . Smoking status: Former Smoker -- 1.00 packs/day    Types: Cigarettes    Quit date: 08/27/1995  . Smokeless tobacco: Current User    Types: Chew  . Alcohol Use: No  . Drug Use: No  . Sexually Active: Not Currently   Other Topics Concern  . Not on file   Social History Narrative  . No narrative on file    Review of Systems: See HPI, otherwise normal ROS  Physical Exam: Temp:  [97.8 F (36.6 C)-97.9 F (36.6 C)] 97.9 F (36.6 C) (07/07 0600) Pulse Rate:  [90-101] 91 (07/07 0600) Resp:  [16-18] 18 (07/07 0600) BP: (101-108)/(64-72) 102/66 mmHg (07/07 0600) SpO2:  [90 %-95 %] 90 % (07/07 0600) Last BM Date: 02/28/13 Pleasant well-developed well-nourished Caucasian male who is in no acute distress. Conjunctiva is pink. Sclerae nonicteric. Oropharyngeal mucosa is normal. He is edentulous(he has dentures which he does not wear because they don't fit). No neck masses or thyromegaly noted. Cardiac exam regular rhythm normal S1 and S2. No murmur or gallop noted. Lungs are clear to auscultation. Abdomen is full with normal bowel sounds. No bruit noted. Abdomen is  soft with mild-to-moderate tenderness across upper abdomen. He is most tender in midepigastric region. No guarding or rebound noted. Rectal examination deferred. No peripheral edema or clubbing noted.  Lab Results:  Recent Labs  02/27/13 1032 02/28/13 0548 03/01/13 0433  WBC 8.0 5.1 4.2  HGB 15.7 12.5* 11.3*  HCT 46.2 38.6* 34.6*  PLT 235 183 171   BMET  Recent Labs  02/27/13 1032 02/28/13 0548 03/01/13 0433  NA 135 136 140  K 3.7 4.1 3.9  CL 96 100 104  CO2 25 31 29   GLUCOSE 141* 82 81  BUN 26* 24* 14  CREATININE 1.38* 1.39* 1.14  CALCIUM 9.3 8.4 8.4   LFT  Recent Labs  02/28/13 0548  PROT 6.3  ALBUMIN 3.0*  AST 28  ALT 24  ALKPHOS 71  BILITOT 0.6   PT/INR No results found for this basename: LABPROT, INR,  in the last 72 hours Hepatitis Panel No results found for this basename: HEPBSAG, HCVAB, HEPAIGM, HEPBIGM,  in the last 72 hours  Studies/Results: Ct Abdomen Pelvis W Contrast  02/27/2013   *RADIOLOGY REPORT*  Clinical Data: Mid abdominal pain.  Nausea and vomiting.  CT ABDOMEN AND PELVIS WITH CONTRAST  Technique:  Multidetector CT imaging of the abdomen and pelvis was performed following the standard protocol during bolus administration of intravenous contrast.  Contrast: 50mL OMNIPAQUE IOHEXOL 300 MG/ML  SOLN, OMNIPAQUE IOHEXOL 300 MG/ML  SOLN  Comparison: 11/26/2012  Findings: Moderate sized hiatal hernia noted.  0.8 cm short axis lymph node adjacent to the stomach just distal to the hiatus. Upper normal sized porta hepatis lymph nodes noted.  Diffuse hepatic steatosis.  The spleen, pancreas, and adrenal glands appear normal.  The gallbladder and biliary system appear unremarkable.  Several tiny left renal cysts are similar to the prior exam from 08/04/2007.  Left mid kidney and 0.5 x 0.3 cm nonobstructive calculus noted.  Linear 4 mm calculus in the left kidney  lower pole.  No ureteral or bladder calculus seen.  Aortoiliac atherosclerotic calcification  is present.  Appendix unremarkable.  Urinary bladder normal.  Mildly prominent prostate gland.  IMPRESSION: 1.  Hiatal hernia with potential dilated distal esophagus, query achalasia. Appearance similar to prior CT scan from 2008.  Upper normal sized upper abdominal lymph nodes. 2.  Diffuse fatty steatosis. 3.  Nonobstructive left nephrolithiasis. 4.  Atherosclerosis. 5.  Stable mild prominence the prostate gland.   Original Report Authenticated By: Gaylyn Rong, M.D.   Dg Chest Port 1 View  02/27/2013   *RADIOLOGY REPORT*  Clinical Data: Short of breath and abdominal pain  PORTABLE CHEST - 1 VIEW  Comparison: 11/26/2012  Findings: The heart size and mediastinal contours are within normal limits.  Both lungs are clear.  The visualized skeletal structures are unremarkable.  IMPRESSION: No active cardiopulmonary abnormalities.   Original Report Authenticated By: Signa Kell, M.D.   I have reviewed abdominopelvic CT. There appears to be angulation to celiac trunk on sagittal view but SMA appears to be normal. There is no wall thickening 2 small or large bowel.  Assessment; Patient is 57 year old Caucasian male who presents with 3-4 week history of upper and mid abdominal pain nausea vomiting as well as hematemesis and melena. GI history is significant for chronic abdominal pain and achalasia for which he underwent Heller's myotomy in December 2009. Serum lipase and transaminases are normal. He could have peptic ulcer disease to account for his pain and upper GI bleed. He does have chronic abdominal pain.    Recommendations; Diagnostic esophagogastroduodenoscopy. Procedure and risks reviewed with the patient and he is agreeable. Study would be performed later today. Further recommendations to follow.   LOS: 2 days   REHMAN,NAJEEB U  03/01/2013, 9:56 AM

## 2013-03-01 NOTE — Op Note (Signed)
EGD PROCEDURE REPORT  PATIENT:  Walter Herring  MR#:  [id number] Birthdate:  10/21/1955, 57 y.o., male Endoscopist:  Dr. Malissa Hippo, MD Referred By:  Dr. Debbe Mounts, MD Procedure Date: 03/01/2013  Procedure:   EGD  Indications:  Patient is 57 year old Caucasian male who presents with upper GI bleed as well as upper and mid abdominal pain. He has history of achalasia status post Heller's myotomy in December 2009. No abnormality noted on CT to account for his pain.       Informed Consent:  The risks, benefits, alternatives & imponderables which include, but are not limited to, bleeding, infection, perforation, drug reaction and potential missed lesion have been reviewed.  The potential for biopsy, lesion removal, esophageal dilation, etc. have also been discussed.  Questions have been answered.  All parties agreeable.  Please see history & physical in medical record for more information.  Medications:  Demerol 50 mg IV Versed 5 mg IV Cetacaine spray topically for oropharyngeal anesthesia  Description of procedure:  The endoscope was introduced through the mouth and advanced to the second portion of the duodenum without difficulty or limitations. The mucosal surfaces were surveyed very carefully during advancement of the scope and upon withdrawal.  Findings:  Esophagus:  Dilated body of the esophagus is scant amount of clear fluid. Distal narrowing but scope easily passed across GEJ. 3 ulcers noted at distal esophagus extending to GEJ. Largest ulcer was a 8 x 15 mm. All with clean base. GEJ:  40 cm Stomach:  Stomach was empty and distended very well with insufflation. Folds in the proximal stomach were normal. Examination of mucosa at body was normal. Few prepyloric erosions noted. Pyloric channel was patent. Angularis fundus and cardia were examined by retroflexing the scope and were normal. Duodenum:  Focal bulbar erythema and single erosion noted. Post bulbar mucosa was  normal.  Therapeutic/Diagnostic Maneuvers Performed:  None  Complications:  None  Impression: Dilated body of esophagus secondary to achalasia. Ulcerative reflux esophagitis felt to be source of patient's upper GI bleed. Erosive gastroduodenitis.  Recommendations:  Continue IV PPI for next 24 hours. Advance diet to full liquids. Sucralfate suspension 1 g by mouth a.c. and each bedtime. H. pylori serology. TSH. CTA abdomen and pelvis to further evaluate patient's abdominal pain and abnormality involving celiac trunk.  Callaway Hardigree U  03/01/2013  11:10 AM  CC: Dr. Isabella Stalling, MD & Dr. Bonnetta Barry ref. provider found

## 2013-03-01 NOTE — Progress Notes (Signed)
*  PRELIMINARY RESULTS* Echocardiogram 2D Echocardiogram has been performed.  Walter Herring 03/01/2013, 2:51 PM

## 2013-03-02 ENCOUNTER — Inpatient Hospital Stay (HOSPITAL_COMMUNITY): Payer: Medicare Other

## 2013-03-02 LAB — BASIC METABOLIC PANEL
BUN: 9 mg/dL (ref 6–23)
Chloride: 106 mEq/L (ref 96–112)
Creatinine, Ser: 1.07 mg/dL (ref 0.50–1.35)
GFR calc non Af Amer: 74 mL/min — ABNORMAL LOW (ref >90)
Glucose, Bld: 86 mg/dL (ref 70–99)
Potassium: 3.8 mEq/L (ref 3.5–5.1)

## 2013-03-02 MED ORDER — PNEUMOCOCCAL VAC POLYVALENT 25 MCG/0.5ML IJ INJ
0.5000 mL | INJECTION | INTRAMUSCULAR | Status: AC
  Administered 2013-03-02: 0.5 mL via INTRAMUSCULAR
  Filled 2013-03-02: qty 0.5

## 2013-03-02 MED ORDER — PANTOPRAZOLE SODIUM 40 MG PO TBEC: 40.0000 mg | DELAYED_RELEASE_TABLET | Freq: Every day | ORAL | Status: DC

## 2013-03-02 MED ORDER — SUCRALFATE 1 GM/10ML PO SUSP: 1.0000 g | Freq: Three times a day (TID) | ORAL | Status: DC

## 2013-03-02 MED ORDER — IOHEXOL 350 MG/ML SOLN
100.0000 mL | Freq: Once | INTRAVENOUS | Status: AC | PRN
Start: 2013-03-02 — End: 2013-03-02
  Administered 2013-03-02: 100 mL via INTRAVENOUS

## 2013-03-02 NOTE — Progress Notes (Signed)
Walter Herring, Walter Herring NO.:  [id number]  MEDICAL RECORD NO.:  [id number]  LOCATION:  A220                          FACILITY:  [facility]  PHYSICIAN:  Melvyn Novas, MDDATE OF BIRTH:  06-Feb-1956  DATE OF PROCEDURE: DATE OF DISCHARGE:                                PROGRESS NOTE   HISTORY:  The patient was seen in consultation by GI.  I have recommended an EGD.  Hopefully, for today, he has been on clear liquids and PPI for 36 hours.  Hemoglobin 11.3, creatinine 1.14, improved. History of achalasia with myotomy in 2008 at [facility].  PHYSICAL EXAMINATION:  LUNGS:  Clear.  No rales, wheeze, or rhonchi. HEART:  Regular rhythm.  No murmurs, gallops, or rubs. ABDOMEN:  Soft, nontender.  Bowel sounds normoactive.  His LFTs and lipase are within normal limits.  Continue current therapy. Await for EGD results and recommendations per GI.     Melvyn Novas, MD     RMD/MEDQ  D:  03/01/2013  T:  03/02/2013  Job:  [id number]

## 2013-03-02 NOTE — Progress Notes (Signed)
I went over CTA findings with patient, his wife and sister-in-law. 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 No intervention indicated at this time. Patient will follow-up with Dr. Darrick Penna.

## 2013-03-02 NOTE — Progress Notes (Signed)
Patient with orders to be discharge home. Discharge instructions given, patient verbalized understanding. Prescriptions given. Patient in stable condition upon discharge. Patient left with spouse in private vehicle.

## 2013-03-02 NOTE — Discharge Summary (Signed)
[id number] 

## 2013-03-02 NOTE — Discharge Summary (Signed)
NAME:  Walter Herring, FILS NO.:  [id number]  MEDICAL RECORD NO.:  [id number]  LOCATION:  A220                          FACILITY:  [facility]  PHYSICIAN:  Melvyn Novas, MDDATE OF BIRTH:  11-08-1955  DATE OF ADMISSION:  02/27/2013 DATE OF DISCHARGE:  07/08/2014LH                              DISCHARGE SUMMARY   The patient is a 57 year old white male with chronic anxiety disorder bipolar with some manic features at times, history of GERD, status post myotomy for achalasia, has hypertension, hyperlipidemia, multiple somatic complaints frequently, insomnia.  The patient was admitted with epigastric pain.  His liver function tests and lipase were normal limits.  His CAT scan of his abdomen and chest reveals no central major abnormalities with the exception of some possible achalasia as result of the Heller myotomy in 2008.  He has had an EGD in 2012 and a colonoscopy revealing no significant pathology.  He had a dilatation of the lower esophagus at that time.  Other than that, the patient is seen in consultation by GI who did an EGD revealing some mild esophageal inflammation and some questionable achalasia and the recommendations were to change is antacid.  He had been taking Prilosec OTC for financial reasons to something more strident.  He had a 2D echocardiogram revealing systolic function of 60-65% with mild LVH.  DISCHARGE MEDICINES: 1. Protonix 40 mg p.o. daily. 2. Elavil 75 mg p.o. at bedtime. 3. Cymbalta 60 mg p.o. daily. 4. Ativan 2 mg p.o. at bedtime p.r.n. for insomnia. 5. Oxycodone 7.5/325 q.6h p.r.n. for chronic low back pain. 6. Pravachol 40 mg p.o. daily. 7. Seroquel 400 mg p.o. at bedtime. 8. AndroGel for diminished testosterone 50 mg/5 g once daily. 9. Sucralfate 1 g in 10 mL q.i.d.  He will be discharged today on the following medicines and follow up in my office within 1 week's time to assess symptoms of epigastric pain, swallowing  dysfunction, and achalasia.     Melvyn Novas, MD     RMD/MEDQ  D:  03/02/2013  T:  03/02/2013  Job:  [id number]

## 2013-03-02 NOTE — Progress Notes (Signed)
Patient's right arm pink, warm, and tender where IV site was removed. Dr. [name] notified.

## 2013-03-03 ENCOUNTER — Telehealth: Payer: Self-pay | Admitting: Gastroenterology

## 2013-03-03 ENCOUNTER — Encounter (HOSPITAL_COMMUNITY): Payer: Self-pay | Admitting: Internal Medicine

## 2013-03-03 NOTE — Progress Notes (Signed)
UR chart review completed.  

## 2013-03-03 NOTE — Telephone Encounter (Signed)
Please set up hospital follow-up with Dr. Darrick Penna in next 4-6 weeks.

## 2013-03-08 ENCOUNTER — Telehealth: Payer: Self-pay | Admitting: Gastroenterology

## 2013-03-08 NOTE — Telephone Encounter (Signed)
I called and spoke to pt's wife. She said pt's blood pressure and pulse was a little better than it was, she did not remember what it was, but said it was not as low as it had been. She said his stools are still very black. He is still constipated. He was told to follow up with Dr. Darrick Penna and she does not have anything for several weeks. Extenders are booked for several days, unless they have an urgent need.   She said she thought that Dr. Karilyn Cota was gong to do TCS while he was in the hospital, but Dr. [name] discharged him.   Please advise!

## 2013-03-08 NOTE — Telephone Encounter (Signed)
Pt's wife called to make OV this Tuesday if possible and I told her nothing would be available until August. Pt was recently in hospital and discharged last Wednesday. Wife thinks patient was released too soon because patient is still passing blood and his blood pressure has dropped. Please advise and call [phone]

## 2013-03-08 NOTE — Telephone Encounter (Signed)
Pt scheduled with AS at 11:30 on 03/09/2013 at 11:30 and his wife is aware. I told her if he has bleeding or too low BP/pulse before then to go to the ED.

## 2013-03-08 NOTE — Telephone Encounter (Signed)
Per SF pt is to see AS in URG spot and pt's wife is aware of OV 7/15 at 1130 with AS per DS

## 2013-03-08 NOTE — Telephone Encounter (Signed)
REVIEWED. AGREE. 

## 2013-03-08 NOTE — Telephone Encounter (Signed)
Pt need urgent visit with AS TO ADDRESS CONCERNS.

## 2013-03-09 ENCOUNTER — Encounter: Payer: Self-pay | Admitting: Internal Medicine

## 2013-03-09 ENCOUNTER — Ambulatory Visit (INDEPENDENT_AMBULATORY_CARE_PROVIDER_SITE_OTHER): Payer: Medicare Other | Admitting: Gastroenterology

## 2013-03-09 VITALS — BP 109/76 | HR 89 | Temp 97.2°F | Ht 73.0 in

## 2013-03-09 DIAGNOSIS — R109 Unspecified abdominal pain: Secondary | ICD-10-CM

## 2013-03-09 DIAGNOSIS — K5900 Constipation, unspecified: Secondary | ICD-10-CM | POA: Insufficient documentation

## 2013-03-09 DIAGNOSIS — K625 Hemorrhage of anus and rectum: Secondary | ICD-10-CM | POA: Insufficient documentation

## 2013-03-09 LAB — CBC WITH DIFFERENTIAL/PLATELET
Basophils Relative: 1 % (ref 0–1)
Eosinophils Absolute: 0.4 10*3/uL (ref 0.0–0.7)
Eosinophils Relative: 8 % — ABNORMAL HIGH (ref 0–5)
HCT: 41.9 % (ref 39.0–52.0)
Hemoglobin: 14.2 g/dL (ref 13.0–17.0)
MCH: 30 pg (ref 26.0–34.0)
MCHC: 33.9 g/dL (ref 30.0–36.0)
Monocytes Absolute: 0.5 10*3/uL (ref 0.1–1.0)
Monocytes Relative: 10 % (ref 3–12)

## 2013-03-09 MED ORDER — PEG-KCL-NACL-NASULF-NA ASC-C 100 G PO SOLR: 1.0000 | ORAL | Status: DC

## 2013-03-09 MED ORDER — LINACLOTIDE 290 MCG PO CAPS: 1.0000 | ORAL_CAPSULE | Freq: Every day | ORAL | Status: DC

## 2013-03-09 MED ORDER — PANTOPRAZOLE SODIUM 40 MG PO TBEC: 40.0000 mg | DELAYED_RELEASE_TABLET | Freq: Two times a day (BID) | ORAL | Status: DC

## 2013-03-09 NOTE — Assessment & Plan Note (Signed)
Linzes 290 mcg daily.

## 2013-03-09 NOTE — Assessment & Plan Note (Signed)
Chronic. EGD and CTA on file. No concern for mesenteric ischemia. Constipation may be playing a role. Patient is a highly anxious individual; weight has actually increased over past year.

## 2013-03-09 NOTE — Assessment & Plan Note (Signed)
57 year old male with chronic abdominal pain and recent admission for upper GI bleed secondary to ulcerative esophagitis, now presents with intermittent low-volume hematochezia in the setting of constipation. Reports melena, yet with further discussion, he states his stool is "less dark". Doubt true melena at this point, and it is reassuring a recent EGD is on file from July 2014. Last colonoscopy in 2009 by Dr. Darrick Penna normal.   Will trial Linzess 290 mcg daily for constipation. CBC stat today Proceed with colonoscopy with Dr. Darrick Penna in the near future. The risks, benefits, and alternatives have been discussed in detail with the patient. They state understanding and desire to proceed.  Increase Protonix to BID Referral to Cardiology due to chest discomfort with exertion; relieves with rest and likely benign.  Further recommendations following colonoscopy

## 2013-03-09 NOTE — Progress Notes (Signed)
Referring Provider: Isabella Stalling, MD Primary Care Physician:  Isabella Stalling, MD Primary GI: Dr. Darrick Penna   Chief Complaint  Patient presents with  . Follow-up    HPI:   [name] is a 57 year old male with a history of chronic abdominal pain, constipation, achalasia s/p Heller myotomy in 2009, with recent UGI bleed in July 2014 secondary to ulcerative reflux esophagitis and erosive gastroduodenitis. Negative H.pylori serology at that time. CTA was negative for mesenteric ischemia. Returns today as urgent work-in due to black stools, constipation, possible "low BP".   States he is still passing black stool, "like soot". Feels tired. BM yesterday, a little lighter. Chronic constipation. No energy. Feels achy after going to bathroom, "hard to breathe" after strenuous activity. Miralax not helping. Pink-tinged blood in stool. "alot". No further N/V. Hurts when eating, mid-abdomen, radiates up to his chest. "achy". Rectal itching at times. Protonix once daily. Dysphagia at baseline.   Lowest BP at home 103/69. Tends to run in the lower range after review of epic. Also reports occasional chest discomfort with exertion that relieves at rest.   Past Medical History  Diagnosis Date  . DDD (degenerative disc disease)   . GERD (gastroesophageal reflux disease)   . COPD (chronic obstructive pulmonary disease)   . Hypercholesterolemia   . Anxiety   . Achalasia 2008 168 LBS  . Chronic abdominal pain     AUG 2010 CT ABD W/ IVC-NL PANCREAS, GB, LIVER  . Hypercholesteremia   . Dysphagia     Past Surgical History  Procedure Laterality Date  . Heller myotomy  DEC 2008 DR. CARL WESTCOTT  . Esophagogastroduodenoscopy       MAR 2012 (RMR) TTS DIL 20 MM, JAN 2010 Gastritis  . Hemorrhoid surgery    . Colonoscopy  2009 [facility]    Normal  . Savory dilation  05/03/2011    Stricture/mild gastritis  . Esophagogastroduodenoscopy N/A 03/01/2013    WUJ:WJXBJYN body of esophagus secondary to  achalasia/Ulcerative reflux esophagitis felt to be source of patient's upper GI bleed/Erosive gastroduodenitis. NORMAL H.plyori serologies    Current Outpatient Prescriptions  Medication Sig Dispense Refill  . albuterol (PROVENTIL) (2.5 MG/3ML) 0.083% nebulizer solution Take 2.5 mg by nebulization every 6 (six) hours as needed for shortness of breath.       Marland Kitchen amitriptyline (ELAVIL) 75 MG tablet Take 75 mg by mouth at bedtime.      . DULoxetine (CYMBALTA) 60 MG capsule Take 60 mg by mouth daily.        . fluticasone (FLONASE) 50 MCG/ACT nasal spray Place 1 spray into the nose daily as needed.       Marland Kitchen LORazepam (ATIVAN) 2 MG tablet Take 2 mg by mouth at bedtime as needed (sleep).      Marland Kitchen oxyCODONE-acetaminophen (PERCOCET) 7.5-325 MG per tablet Take 1 tablet by mouth every 6 (six) hours as needed for pain.       . pantoprazole (PROTONIX) 40 MG tablet Take 1 tablet (40 mg total) by mouth daily.  30 tablet  3  . polyethylene glycol (MIRALAX / GLYCOLAX) packet Take 17 g by mouth daily as needed (constipation).      . pravastatin (PRAVACHOL) 40 MG tablet Take 40 mg by mouth daily.       . QUEtiapine (SEROQUEL) 400 MG tablet Take 400 mg by mouth at bedtime.       . sucralfate (CARAFATE) 1 GM/10ML suspension Take 10 mLs (1 g total) by mouth 4 (four) times daily -  with meals and at bedtime.  420 mL  0  . testosterone (ANDROGEL) 50 MG/5GM GEL Place 5 g onto the skin daily.       No current facility-administered medications for this visit.    Allergies as of 03/09/2013 - Review Complete 03/09/2013  Allergen Reaction Noted  . Dairy aid (lactase) Nausea And Vomiting 12/06/2010    Family History  Problem Relation Age of Onset  . Colon cancer Neg Hx   . Colon polyps Neg Hx   . Anesthesia problems Neg Hx   . Hypotension Neg Hx   . Malignant hyperthermia Neg Hx   . Pseudochol deficiency Neg Hx     History   Social History  . Marital Status: Married    Spouse Name: N/A    Number of Children:  N/A  . Years of Education: N/A   Social History Main Topics  . Smoking status: Former Smoker -- 1.00 packs/day    Types: Cigarettes    Quit date: 08/27/1995  . Smokeless tobacco: Current User    Types: Chew  . Alcohol Use: No  . Drug Use: No  . Sexually Active: Not Currently   Other Topics Concern  . None   Social History Narrative  . None    Review of Systems: Negative unless mentioned in HPI.   Physical Exam: BP 109/76  Pulse 89  Temp(Src) 97.2 F (36.2 C) (Oral)  Ht 6\' 1"  (1.854 m) General:   Alert and oriented. Anxious but cooperative. Head:  Normocephalic and atraumatic. Eyes:  Conjuctiva clear without scleral icterus. Heart:  S1, S2 present without murmurs, rubs, or gallops. Regular rate and rhythm. Abdomen:  +BS, soft, non-tender and non-distended. No rebound or guarding. No HSM or masses noted. Msk:  Symmetrical without gross deformities. Normal posture. Extremities:  Without edema. Neurologic:  Alert and  oriented x4;  grossly normal neurologically. Skin:  Intact without significant lesions or rashes. Psych:  Alert and cooperative. Normal mood and affect.  Lab Results  Component Value Date   WBC 4.2 03/01/2013   HGB 11.3* 03/01/2013   HCT 34.6* 03/01/2013   MCV 89.9 03/01/2013   PLT 171 03/01/2013

## 2013-03-09 NOTE — Patient Instructions (Addendum)
Please increase Protonix to twice a day, 30 minutes before breakfast and dinner.   Stop Miralax for now. Start taking Linzess 1 capsule, 30 minutes before breakfast. This is a constipation medication. I have given you a voucher to get a month supply free. Refills are also available at your pharmacy.   We have scheduled you for a colonoscopy with Dr. Darrick Penna in the near future.  Please have labs done today. We will call with the result.  Finally, I have referred you to cardiology for further evaluation of fatigue and chest pain with exertion. This is a good starting point; your primary care doctor can refer you to other specialists as indicated.

## 2013-03-09 NOTE — Progress Notes (Signed)
Cc PCP 

## 2013-03-10 ENCOUNTER — Other Ambulatory Visit: Payer: Self-pay | Admitting: Gastroenterology

## 2013-03-10 DIAGNOSIS — R0602 Shortness of breath: Secondary | ICD-10-CM

## 2013-03-10 DIAGNOSIS — R079 Chest pain, unspecified: Secondary | ICD-10-CM

## 2013-03-10 NOTE — Progress Notes (Signed)
Quick Note:  His CBC is great! Hgb normal at 14.2  Proceed with TCS as planned. ______

## 2013-03-10 NOTE — Progress Notes (Signed)
Quick Note:  Called and informed pt. ______ 

## 2013-03-11 ENCOUNTER — Encounter (HOSPITAL_COMMUNITY): Payer: Self-pay | Admitting: Pharmacy Technician

## 2013-03-12 ENCOUNTER — Ambulatory Visit (INDEPENDENT_AMBULATORY_CARE_PROVIDER_SITE_OTHER): Payer: Medicare Other | Admitting: Cardiovascular Disease

## 2013-03-12 ENCOUNTER — Encounter: Payer: Self-pay | Admitting: Cardiovascular Disease

## 2013-03-12 VITALS — BP 118/72 | HR 113 | Ht 73.0 in | Wt 202.0 lb

## 2013-03-12 DIAGNOSIS — R000 Tachycardia, unspecified: Secondary | ICD-10-CM

## 2013-03-12 DIAGNOSIS — I10 Essential (primary) hypertension: Secondary | ICD-10-CM

## 2013-03-12 DIAGNOSIS — I498 Other specified cardiac arrhythmias: Secondary | ICD-10-CM

## 2013-03-12 DIAGNOSIS — R0602 Shortness of breath: Secondary | ICD-10-CM

## 2013-03-12 DIAGNOSIS — R079 Chest pain, unspecified: Secondary | ICD-10-CM

## 2013-03-12 MED ORDER — METOPROLOL SUCCINATE ER 25 MG PO TB24: 25.0000 mg | ORAL_TABLET | Freq: Every day | ORAL | Status: DC

## 2013-03-12 NOTE — Patient Instructions (Addendum)
Your physician recommends that you schedule a follow-up appointment in: 4-6 weeks  Your physician recommends THAT YOU HAVE A LUNG SCAN PERFORMED A staff member from our office will alert you the with appointment date and time, once available  Your physician has recommended you make the following change in your medication:   1) TOPROL XL 25MG  ONCE DAILY

## 2013-03-12 NOTE — Progress Notes (Signed)
Patient ID: Walter Herring, male   DOB: May 15, 1956, 57 y.o.   MRN: [id number]    CARDIOLOGY CONSULT NOTE  Patient ID: Walter Herring MRN: [id number] DOB/AGE: [birth year] 57 y.o.  Admit date: (Not on file) Primary Physician: Oval Linsey Reason for Consultation: chest pain/SOB  HPI:  Walter Herring is a 57 year old male with chronic abdominal pain and recent admission for upper GI bleed secondary to ulcerative reflux esophagitis and erosive gastroduodenitis, achalasia s/p Heller myotomy in 2009, who has been experiencing exertional chest pain and dyspnea on exertion. He also has bipolar disorder and has been described as a highly anxious person.  A recent BMP, CBC, and TSH were unremarkable.  He is here with his wife today. They have a BP monitor at home, and they have noticed his HR has been going quite fast for months. He reportedly had a heart cath 5-6 years ago and was told "everything looked great".  He has chickens in the back, and when he walks to the shed he feels worn out. He says he's felt this way for months. He does say he feels very warm at home but is uncertain as to whether or not he had a fever.   Review of systems complete and found to be negative unless listed above in HPI  Past Medical History: see HPI   Family History  Problem Relation Age of Onset  . Colon cancer Neg Hx   . Colon polyps Neg Hx   . Anesthesia problems Neg Hx   . Hypotension Neg Hx   . Malignant hyperthermia Neg Hx   . Pseudochol deficiency Neg Hx     History   Social History  . Marital Status: Married    Spouse Name: N/A    Number of Children: N/A  . Years of Education: N/A   Occupational History  . Not on file.   Social History Main Topics  . Smoking status: Former Smoker -- 1.00 packs/day    Types: Cigarettes    Quit date: 08/27/1995  . Smokeless tobacco: Current User    Types: Chew  . Alcohol Use: No  . Drug Use: No  . Sexually Active: Not Currently   Other Topics Concern  .  Not on file   Social History Narrative  . No narrative on file      Physical exam 118/72 mmHg HR 113 bpm  General: NAD Neck: No JVD, no thyromegaly or thyroid nodule.  Lungs: Clear to auscultation bilaterally with normal respiratory effort. CV: Nondisplaced PMI.  Heart tachycardic, normal S1/S2, no S3/S4, no murmur.  No peripheral edema.  No carotid bruit.  Normal pedal pulses.  Abdomen: Soft, nontender, no hepatosplenomegaly, no distention.  Skin: Intact without lesions or rashes.  Neurologic: Alert and oriented x 3.  Psych: Normal affect. Extremities: No clubbing or cyanosis.  HEENT: Normal.   Labs:   Lab Results  Component Value Date   WBC 5.0 03/09/2013   HGB 14.2 03/09/2013   HCT 41.9 03/09/2013   MCV 88.4 03/09/2013   PLT 325 03/09/2013   No results found for this basename: NA, K, CL, CO2, BUN, CREATININE, CALCIUM, LABALBU, PROT, BILITOT, ALKPHOS, ALT, AST, GLUCOSE,  in the last 168 hours Lab Results  Component Value Date   CKTOTAL 42 08/07/2007   CKMB 0.6 08/07/2007   TROPONINI <0.30 02/27/2013    Lab Results  Component Value Date   CHOL  Value: 234        ATP III CLASSIFICATION:  <200  mg/dL   Desirable  161-096  mg/dL   Borderline High  >=045    mg/dL   High* 40/98/1191   Lab Results  Component Value Date   HDL 27* 08/05/2007   Lab Results  Component Value Date   LDLCALC  Value: 181        Total Cholesterol/HDL:CHD Risk Coronary Heart Disease Risk Table                     Men   Women  1/2 Average Risk   3.4   3.3* 08/05/2007   Lab Results  Component Value Date   TRIG 132 08/05/2007   Lab Results  Component Value Date   CHOLHDL 8.7 08/05/2007   No results found for this basename: LDLDIRECT       EKG: Sinus tachycardia, rate 121 bpm, axis within normal limits, intervals within normal limits, no acute ST-T wave changes.  Echo (July 2014): Left ventricle: The cavity size was normal. Wall thickness was increased in a pattern of mild LVH. Systolic  function was normal. The estimated ejection fraction was in the range of 60% to 65%. Left ventricular diastolic function parameters were normal.     ASSESSMENT AND PLAN:  1. Chest pain/SOB: he apparently had a normal cardiac catheterization some years ago at [facility], and I will obtain this report. His blood tests have been unremarkable. He currently has normal LV systolic and diastolic function. I will obtain a VQ scan to evaluate for pulmonary emboli, given his resting sinus tachycardia which is not explained by infection, anemia, or thyroid disease. 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 Again, there are no obvious reason for him to have this, given the absence of anemia, thyroid disease, and infection. I will start Toprol XL 25 mg daily.  Signed: Prentice Docker, M.D., F.A.C.C. 03/12/2013, 1:12 PM

## 2013-03-17 ENCOUNTER — Ambulatory Visit: Payer: Medicare Other | Admitting: Gastroenterology

## 2013-03-18 MED ORDER — SODIUM CHLORIDE 0.9 % IV SOLN: INTRAVENOUS | Status: DC

## 2013-03-18 NOTE — Progress Notes (Signed)
REVIEWED.  TCS/? Hemorrhoid banding

## 2013-03-19 ENCOUNTER — Encounter (HOSPITAL_COMMUNITY)
Admission: RE | Disposition: A | Discharge status: Home / Self Care | Payer: Self-pay | Source: Ambulatory Visit | Attending: Gastroenterology

## 2013-03-19 ENCOUNTER — Encounter (HOSPITAL_COMMUNITY): Payer: Self-pay | Admitting: *Deleted

## 2013-03-19 ENCOUNTER — Ambulatory Visit (HOSPITAL_COMMUNITY)
Admission: RE | Admit: 2013-03-19 | Discharge: 2013-03-19 | Disposition: A | Discharge status: Home / Self Care | Payer: Medicare Other | Source: Ambulatory Visit | Attending: Gastroenterology | Admitting: Gastroenterology

## 2013-03-19 DIAGNOSIS — K620 Anal polyp: Secondary | ICD-10-CM | POA: Insufficient documentation

## 2013-03-19 DIAGNOSIS — K621 Rectal polyp: Secondary | ICD-10-CM

## 2013-03-19 DIAGNOSIS — K625 Hemorrhage of anus and rectum: Secondary | ICD-10-CM

## 2013-03-19 DIAGNOSIS — IMO0002 Reserved for concepts with insufficient information to code with codable children: Secondary | ICD-10-CM | POA: Insufficient documentation

## 2013-03-19 DIAGNOSIS — Z79899 Other long term (current) drug therapy: Secondary | ICD-10-CM | POA: Insufficient documentation

## 2013-03-19 DIAGNOSIS — K648 Other hemorrhoids: Secondary | ICD-10-CM | POA: Insufficient documentation

## 2013-03-19 DIAGNOSIS — K5900 Constipation, unspecified: Secondary | ICD-10-CM

## 2013-03-19 DIAGNOSIS — J449 Chronic obstructive pulmonary disease, unspecified: Secondary | ICD-10-CM | POA: Insufficient documentation

## 2013-03-19 DIAGNOSIS — Z87891 Personal history of nicotine dependence: Secondary | ICD-10-CM | POA: Insufficient documentation

## 2013-03-19 DIAGNOSIS — D126 Benign neoplasm of colon, unspecified: Secondary | ICD-10-CM

## 2013-03-19 DIAGNOSIS — J4489 Other specified chronic obstructive pulmonary disease: Secondary | ICD-10-CM | POA: Insufficient documentation

## 2013-03-19 DIAGNOSIS — K220 Achalasia of cardia: Secondary | ICD-10-CM | POA: Insufficient documentation

## 2013-03-19 DIAGNOSIS — E7800 Pure hypercholesterolemia, unspecified: Secondary | ICD-10-CM | POA: Insufficient documentation

## 2013-03-19 DIAGNOSIS — F411 Generalized anxiety disorder: Secondary | ICD-10-CM | POA: Insufficient documentation

## 2013-03-19 DIAGNOSIS — E739 Lactose intolerance, unspecified: Secondary | ICD-10-CM | POA: Insufficient documentation

## 2013-03-19 DIAGNOSIS — K219 Gastro-esophageal reflux disease without esophagitis: Secondary | ICD-10-CM | POA: Insufficient documentation

## 2013-03-19 HISTORY — PX: COLONOSCOPY: SHX5424

## 2013-03-19 SURGERY — COLONOSCOPY
Anesthesia: Moderate Sedation

## 2013-03-19 MED ORDER — MEPERIDINE HCL 100 MG/ML IJ SOLN
INTRAMUSCULAR | Status: DC | PRN
  Administered 2013-03-19 (×2): 50 mg via INTRAVENOUS

## 2013-03-19 MED ORDER — MEPERIDINE HCL 100 MG/ML IJ SOLN
INTRAMUSCULAR | Status: AC
  Filled 2013-03-19: qty 1

## 2013-03-19 MED ORDER — MIDAZOLAM HCL 5 MG/5ML IJ SOLN
INTRAMUSCULAR | Status: DC | PRN
  Administered 2013-03-19 (×2): 2 mg via INTRAVENOUS
  Administered 2013-03-19: 1 mg via INTRAVENOUS

## 2013-03-19 MED ORDER — PROMETHAZINE HCL 25 MG/ML IJ SOLN
INTRAMUSCULAR | Status: DC | PRN
  Administered 2013-03-19: 12.5 mg via INTRAVENOUS

## 2013-03-19 MED ORDER — MIDAZOLAM HCL 5 MG/5ML IJ SOLN
INTRAMUSCULAR | Status: AC
  Filled 2013-03-19: qty 10

## 2013-03-19 MED ORDER — SODIUM CHLORIDE 0.9 % IV SOLN
INTRAVENOUS | Status: DC
  Administered 2013-03-19: 10:00:00 via INTRAVENOUS

## 2013-03-19 MED ORDER — PROMETHAZINE HCL 25 MG/ML IJ SOLN
INTRAMUSCULAR | Status: AC
  Filled 2013-03-19: qty 1

## 2013-03-19 NOTE — H&P (Signed)
Primary Care Physician:  Isabella Stalling, MD Primary Gastroenterologist:  Dr. Darrick Penna  Pre-Procedure History & Physical: HPI:  Walter Herring is a 57 y.o. male here for  BRBPR.   Past Medical History  Diagnosis Date  . DDD (degenerative disc disease)   . GERD (gastroesophageal reflux disease)   . COPD (chronic obstructive pulmonary disease)   . Hypercholesterolemia   . Anxiety   . Achalasia 2008 168 LBS  . Chronic abdominal pain     AUG 2010 CT ABD W/ IVC-NL PANCREAS, GB, LIVER  . Hypercholesteremia   . Dysphagia     Past Surgical History  Procedure Laterality Date  . Heller myotomy  DEC 2008 DR. CARL WESTCOTT  . Esophagogastroduodenoscopy       MAR 2012 (RMR) TTS DIL 20 MM, JAN 2010 Gastritis  . Hemorrhoid surgery    . Colonoscopy  2009 [facility]    Normal  . Savory dilation  05/03/2011    Stricture/mild gastritis  . Esophagogastroduodenoscopy N/A 03/01/2013    WUJ:WJXBJYN body of esophagus secondary to achalasia/Ulcerative reflux esophagitis felt to be source of patient's upper GI bleed/Erosive gastroduodenitis. NORMAL H.plyori serologies    Prior to Admission medications   Medication Sig Start Date End Date Taking? Authorizing Provider  albuterol (PROVENTIL) (2.5 MG/3ML) 0.083% nebulizer solution Take 2.5 mg by nebulization every 6 (six) hours as needed for shortness of breath.    Yes Historical Provider, MD  amitriptyline (ELAVIL) 75 MG tablet Take 75 mg by mouth at bedtime.   Yes Historical Provider, MD  DULoxetine (CYMBALTA) 60 MG capsule Take 60 mg by mouth daily.     Yes Historical Provider, MD  fluticasone (FLONASE) 50 MCG/ACT nasal spray Place 1 spray into the nose daily as needed for rhinitis or allergies.  11/24/12  Yes Historical Provider, MD  LORazepam (ATIVAN) 2 MG tablet Take 2 mg by mouth at bedtime as needed for anxiety (sleep).    Yes Historical Provider, MD  metoprolol succinate (TOPROL-XL) 25 MG 24 hr tablet Take 1 tablet (25 mg total) by mouth daily. 03/12/13   Yes Laqueta Linden, MD  oxyCODONE-acetaminophen (PERCOCET) 7.5-325 MG per tablet Take 1 tablet by mouth every 6 (six) hours as needed for pain.  10/10/11  Yes Historical Provider, MD  peg 3350 powder (MOVIPREP) 100 G SOLR Take 1 kit (100 g total) by mouth as directed. PHARMACIST USE THE FOLLOWING FOR PATIENT DISCOUNT BIN: [id number] GROUP: [id number] ID: [id number] PATIENTS WILL SAVE UP TO $10 ON THEIR OUT-OF-POCKET EXPENSE FOR PROCESSING QUESTIONS, CALL [phone] 03/09/13  Yes West Bali, MD  pravastatin (PRAVACHOL) 40 MG tablet Take 40 mg by mouth daily.    Yes Historical Provider, MD  QUEtiapine (SEROQUEL) 400 MG tablet Take 400 mg by mouth at bedtime.  11/24/12  Yes Historical Provider, MD  sucralfate (CARAFATE) 1 GM/10ML suspension Take 10 mLs (1 g total) by mouth 4 (four) times daily -  with meals and at bedtime. 03/02/13  Yes Isabella Stalling, MD  testosterone (ANDROGEL) 50 MG/5GM GEL Place 5 g onto the skin daily.   Yes Historical Provider, MD  Linaclotide 290 MCG CAPS Take 1 capsule by mouth daily. 03/09/13   Nira Retort, NP  pantoprazole (PROTONIX) 40 MG tablet Take 1 tablet (40 mg total) by mouth 2 (two) times daily. 03/09/13   Nira Retort, NP  polyethylene glycol [facility] Ethelene Hal) packet Take 17 g by mouth daily as needed (constipation).    Historical Provider, MD  Allergies as of 03/09/2013 - Review Complete 03/09/2013  Allergen Reaction Noted  . Dairy aid (lactase) Nausea And Vomiting 12/06/2010    Family History  Problem Relation Age of Onset  . Colon cancer Neg Hx   . Colon polyps Neg Hx   . Anesthesia problems Neg Hx   . Hypotension Neg Hx   . Malignant hyperthermia Neg Hx   . Pseudochol deficiency Neg Hx   . Cancer      family history  . Heart attack      family history    History   Social History  . Marital Status: Married    Spouse Name: N/A    Number of Children: N/A  . Years of Education: N/A   Occupational History  . Not on file.   Social  History Main Topics  . Smoking status: Former Smoker -- 1.00 packs/day    Types: Cigarettes    Quit date: 08/27/1995  . Smokeless tobacco: Current User    Types: Chew  . Alcohol Use: No  . Drug Use: No  . Sexually Active: Not Currently   Other Topics Concern  . Not on file   Social History Narrative  . No narrative on file    Review of Systems: See HPI, otherwise negative ROS   Physical Exam: BP 126/87  Pulse 89  Temp(Src) 98 F (36.7 C) (Oral)  Resp 18  SpO2 96% General:   Alert,  pleasant and cooperative in NAD Head:  Normocephalic and atraumatic. Neck:  Supple; Lungs:  Clear throughout to auscultation.    Heart:  Regular rate and rhythm. Abdomen:  Soft, nontender and nondistended. Normal bowel sounds, without guarding, and without rebound.   Neurologic:  Alert and  oriented x4;  grossly normal neurologically.  Impression/Plan:    BRBPR  PLAN: TCS TODAY

## 2013-03-19 NOTE — Op Note (Signed)
[facility] [address] [place], [address]   COLONOSCOPY PROCEDURE REPORT  PATIENT: Walter Herring, Walter Herring  MR#: [id number] BIRTHDATE: 1955-09-20 , 57  yrs. old GENDER: Male ENDOSCOPIST: [name], MD REFERRED QM:[id number] [name], M.D. PROCEDURE DATE:  03/19/2013 PROCEDURE:   Colonoscopy with cold biopsy polypectomy INDICATIONS:Rectal Bleeding. MEDICATIONS: Promethazine (Phenergan) 12.5mg  IV, Demerol 100 mg IV, and Versed 5 mg IV DESCRIPTION OF PROCEDURE:    Physical exam was performed.  Informed consent was obtained from the patient after explaining the benefits, risks, and alternatives to procedure.  The patient was connected to monitor and placed in left lateral position. Continuous oxygen was provided by nasal cannula and IV medicine administered through an indwelling cannula.  After administration of sedation and rectal exam, the patients rectum was intubated and the EC-3890Li ([id number]), EG-2990i ([id number]), and EC-3890Li ([id number])  colonoscope was advanced under direct visualization to the ileum.  The scope was removed slowly by carefully examining the color, texture, anatomy, and integrity mucosa on the way out.  The patient was recovered in endoscopy and discharged home in satisfactory condition.    COLON FINDINGS: The mucosa appeared normal in the terminal ileum.  , Five sessile polyps ranging between 3-66mm in size were found at the cecum and in the ascending colon.  A polypectomy was performed with cold forceps.  , and Six sessile polyps ranging between 3-55mm in size were found in the descending colon and rectum.  A polypectomy was performed with cold forceps.   MODERATE INTERNAL HEMORRHOIDS. 2 BANDS APPLIED. MUCOSAL TEAR IN RECTUM FROM SCOPE. PREP QUALITY: good.  CECAL W/D TIME: 20 minutes       COMPLICATIONS: None  ENDOSCOPIC IMPRESSION: 1.   11 COLORECTAL POLYPS REMOVED 2.   RECTAL BLEEDING DUE TO INTERNAL HEMORRHOIDS  RECOMMENDATIONS: CALL  [phone] IF FOR A FEVER, A LARGE AMOUNT OF BLEEDING, OR DIFFICULTY URINATING.  CONTINUE LINZESS. DRINK WATER TO KEEP URINE LIGHT YELLOW. MAY USE NAPROXEN TWICE DAILY AS NEEDED FOR RECTAL DISCOMFORT. TYLENOL AS NEED FOR ADDITIONAL PAIN RELIEF. MAY USE COLACE TWICE DAILY TO SOFTEN STOOL.  HOLD FOR DIARRHEA. FOLLOW A LOW RESIDUE DIET FOR THE NEXT 2-3 WEEKS. FOLLOW UP IN AUG 6 AT 215 PM. CONSIDER [facility] BANDING.  Next colonoscopy in 5 years.       _______________________________ Rosalie DoctorJonette Eva, MD 03/19/2013 12:58 PM     PATIENT NAME:  Walter Herring, Walter Herring MR#: [id number]

## 2013-03-22 ENCOUNTER — Other Ambulatory Visit: Payer: Self-pay | Admitting: Cardiovascular Disease

## 2013-03-22 ENCOUNTER — Telehealth: Payer: Self-pay | Admitting: *Deleted

## 2013-03-22 DIAGNOSIS — R0602 Shortness of breath: Secondary | ICD-10-CM

## 2013-03-22 DIAGNOSIS — R079 Chest pain, unspecified: Secondary | ICD-10-CM

## 2013-03-22 NOTE — Telephone Encounter (Signed)
Called Walter Herring to advise his apt for the lung scan has been scheduled for tomorrow 03-23-13 at 8:30am, he will need to arrive to [facility] at 8:15am, Walter Herring accepted apt and was informed we will contact him with the results once available, will have Dr. Purvis Sheffield sign the order placed in chart by [facility] rep ryan

## 2013-03-23 ENCOUNTER — Encounter (HOSPITAL_COMMUNITY): Payer: Self-pay | Admitting: Gastroenterology

## 2013-03-23 ENCOUNTER — Other Ambulatory Visit: Payer: Self-pay | Admitting: *Deleted

## 2013-03-23 ENCOUNTER — Encounter (HOSPITAL_COMMUNITY)
Admission: RE | Admit: 2013-03-23 | Discharge: 2013-03-23 | Disposition: A | Discharge status: Home / Self Care | Payer: Medicare Other | Source: Ambulatory Visit | Attending: Cardiovascular Disease | Admitting: Cardiovascular Disease

## 2013-03-23 ENCOUNTER — Ambulatory Visit (HOSPITAL_COMMUNITY)
Admission: RE | Admit: 2013-03-23 | Discharge: 2013-03-23 | Disposition: A | Discharge status: Home / Self Care | Payer: Medicare Other | Source: Ambulatory Visit | Attending: Cardiovascular Disease | Admitting: Cardiovascular Disease

## 2013-03-23 DIAGNOSIS — R0602 Shortness of breath: Secondary | ICD-10-CM | POA: Insufficient documentation

## 2013-03-23 DIAGNOSIS — R079 Chest pain, unspecified: Secondary | ICD-10-CM | POA: Insufficient documentation

## 2013-03-23 MED ORDER — TECHNETIUM TC 99M DIETHYLENETRIAME-PENTAACETIC ACID
40.0000 | Freq: Once | INTRAVENOUS | Status: AC | PRN
  Administered 2013-03-23: 40 via RESPIRATORY_TRACT

## 2013-03-23 MED ORDER — TECHNETIUM TO 99M ALBUMIN AGGREGATED
6.0000 | Freq: Once | INTRAVENOUS | Status: AC | PRN
  Administered 2013-03-23: 6 via INTRAVENOUS

## 2013-03-23 NOTE — Telephone Encounter (Signed)
Received incoming call from ryan with [facility] to advise pt also needs chest x-ray for this test, placed order in chart, signed by Dr. Purvis Sheffield  As well as signature for lung scan in place per Dr. Purvis Sheffield advised verbally, pt currently having lung scan and will have chest x-ray after testing, pt aware someone will call him with the results once available

## 2013-03-26 ENCOUNTER — Telehealth: Payer: Self-pay | Admitting: Gastroenterology

## 2013-03-26 NOTE — Telephone Encounter (Signed)
OV made and reminder in epic °

## 2013-03-26 NOTE — Telephone Encounter (Signed)
Please call pt. HE had simple adenomas removed from HIS colon. FOLLOW A HIGH FIBER DIET. TCS IN 5 YEARS. Continue LINZESS.  LOW RESIDUE DIET UNTIL NEXT VISIT AUG 6 at 215p. Use Colace as needed to soften stool.

## 2013-03-26 NOTE — Telephone Encounter (Signed)
Pt's wife aware and copy of low residue diet in mail.

## 2013-03-29 ENCOUNTER — Telehealth: Payer: Self-pay | Admitting: *Deleted

## 2013-03-29 NOTE — Telephone Encounter (Signed)
We had to change patient appointment due to schedule changes. This patient is now scheduled for 8/22, please call them with test results.

## 2013-03-29 NOTE — Telephone Encounter (Signed)
Please advise results read in the chart for lung scan however no instructions are noted in the chart for the pt and his apt had to rescheduled and pt did not want to wait until his apt to have results

## 2013-03-30 NOTE — Discharge Summary (Signed)
[id number] 

## 2013-03-31 ENCOUNTER — Ambulatory Visit (INDEPENDENT_AMBULATORY_CARE_PROVIDER_SITE_OTHER): Payer: Medicare Other | Admitting: Gastroenterology

## 2013-03-31 ENCOUNTER — Encounter: Payer: Self-pay | Admitting: Gastroenterology

## 2013-03-31 ENCOUNTER — Telehealth: Payer: Self-pay

## 2013-03-31 VITALS — BP 116/81 | HR 90 | Temp 97.4°F | Ht 73.0 in | Wt 205.6 lb

## 2013-03-31 DIAGNOSIS — R21 Rash and other nonspecific skin eruption: Secondary | ICD-10-CM | POA: Insufficient documentation

## 2013-03-31 DIAGNOSIS — K648 Other hemorrhoids: Secondary | ICD-10-CM | POA: Insufficient documentation

## 2013-03-31 MED ORDER — CLOTRIMAZOLE 1 % EX CREA: TOPICAL_CREAM | CUTANEOUS | Status: DC

## 2013-03-31 MED ORDER — PANTOPRAZOLE SODIUM 40 MG PO TBEC: 40.0000 mg | DELAYED_RELEASE_TABLET | Freq: Two times a day (BID) | ORAL | Status: AC

## 2013-03-31 NOTE — Assessment & Plan Note (Signed)
FOLLOW UP IN 2 WEEKS FOR ADDITIONAL BANDING.  FOLLOW A HIGH FIBER DIET. AVOID ITEMS THAT CAUSE BLOATING AND GAS. SEE INFO BELOW.  DRINK WATER TO KEEP HER URINE LIGHT YELLOW.  USE FIBER POWDER THREE TIMES A DAY.  SIT FOR LESS THAN 2-5 MINUTES ON THE COMMODE.

## 2013-03-31 NOTE — Telephone Encounter (Signed)
Lung scan (ventilation perfusion scan) was normal. Please notify patient.

## 2013-03-31 NOTE — Telephone Encounter (Signed)
PLEASE CALL PT.  Rx sent.  

## 2013-03-31 NOTE — Assessment & Plan Note (Signed)
DUE TO CANDIDIASIS.  USE LOTRIMIN THREE TIMES DAILY FOR 10 DAYS.  FOLLOW UP IN 2 WEEKS TO RE-ASSESS PERI-NEAL AREA.

## 2013-03-31 NOTE — Telephone Encounter (Signed)
Pt said his Protonix has been increased to bid and his prescription still says once a day. Request new prescription sent to [facility].

## 2013-03-31 NOTE — Telephone Encounter (Signed)
.  left message to have patient return my call.  

## 2013-03-31 NOTE — Discharge Summary (Signed)
NAMETRENT, GABLER NO.:  [id number]  MEDICAL RECORD NO.:  [id number]  LOCATION:                        FACILITY:  [facility]  PHYSICIAN:  Melvyn Novas, MDDATE OF BIRTH:  Sep 05, 1955  DATE OF ADMISSION:  02/27/2013 DATE OF DISCHARGE:  07/08/2014LH                              DISCHARGE SUMMARY   ADDENDUM:  The patient had positivity on this emesis in the ER due to acute reflux esophagitis with possible GI erosions and some bleeding as result of this, combined with achalasia.  For this reason, he was administered PPI IV 40 mg q.12 hours.  He also had an EGD to document this.     Melvyn Novas, MD     RMD/MEDQ  D:  03/30/2013  T:  03/31/2013  Job:  [id number]

## 2013-03-31 NOTE — Progress Notes (Signed)
SYMPTOMS: RECTAL BLEEDING RARE BUT IMPROVED, PRESSURE,  ITCHING, BURNING.  CONSTIPATION: YES\ DIARRHEA: YES  STRAINS WITH BMs: YES-SOMETIMES  TIME SPENT ON TOILET: 10 MINS TISSUE POKES OUT OF RECTUM: NO FIBER SUPPLEMENTS: NO  GLASSES OF WATER/DAY: 6-8: YES   ADDITIONAL QUESTIONS:  LATEX ALLERGY: NO PREGNANT: NO ERECTILE DYSFUNCTION MEDS OR NITRATES: NO ANTICOAGULATION/ANTIPLATELET MEDS: NO DIAGNOSED WITH CROHN'S DISEASE, PROCTITIS, PORTAL HTN, OR ANAL/RECTAL CA: NO TAKING IMMUNOSUPPRESSANTS/XRT: NO  PLAN: 1. ANOSCOPY/[facility] BANDING TODAY 2. R POSTERIOR BAND IN 2 WEEKS.  PROCEDURE; ANOSCOPY/[facility] BANDING PROCEDURE TECHNIQUE: BENEFITS RISK EXPLAINED TO PT. SCOPE PLACED. REDUNDANT TISSUE IN R ANTERIOR AND R POSTERIOR HEMORRHOID BUNDLE. NO REDUNDANT TISSUE IN LEFT LATERAL HEMORRHOID BUNDLE. ERYTHEMA AND DRYNESS IN PER-NEAL AREA. ONE [facility] BAND PLACED IN RIGHT ANTERIOR POSITION. POST-BANDING RECTAL EXAM REVEALED GOOD PLACEMENT. EXAM NON-TENDER.

## 2013-03-31 NOTE — Patient Instructions (Addendum)
USE LOTRIMIN THREE TIMES DAILY FOR 10 DAYS.  FOLLOW UP IN 2 WEEKS FOR ADDITIONAL BANDING.  FOLLOW A HIGH FIBER DIET. AVOID ITEMS THAT CAUSE BLOATING AND GAS. SEE INFO BELOW.  DRINK WATER TO KEEP HER URINE LIGHT YELLOW.  USE FIBER POWDER THREE TIMES A DAY.  SIT FOR LESS THAN 2-5 MINUTES ON THE COMMODE.      High-Fiber Diet A high-fiber diet changes your normal diet to include more whole grains, legumes, fruits, and vegetables. Changes in the diet involve replacing refined carbohydrates with unrefined foods. The calorie level of the diet is essentially unchanged. The Dietary Reference Intake (recommended amount) for adult males is 38 grams per day. For adult females, it is 25 grams per day. Pregnant and lactating women should consume 28 grams of fiber per day. Fiber is the intact part of a plant that is not broken down during digestion. Functional fiber is fiber that has been isolated from the plant to provide a beneficial effect in the body. PURPOSE  Increase stool bulk.   Ease and regulate bowel movements.   Lower cholesterol.  INDICATIONS THAT YOU NEED MORE FIBER  Constipation and hemorrhoids.   Uncomplicated diverticulosis (intestine condition) and irritable bowel syndrome.   Weight management.   As a protective measure against hardening of the arteries (atherosclerosis), diabetes, and cancer.   DO NOT USE WITH:  Acute diverticulitis (intestine infection).   Partial small bowel obstructions.   Complicated diverticular disease involving bleeding, rupture (perforation), or abscess (boil, furuncle).   Presence of autonomic neuropathy (nerve damage) or gastroparesis (stomach cannot empty itself).    GUIDELINES FOR INCREASING FIBER IN THE DIET  Start adding fiber to the diet slowly. A gradual increase of about 5 more grams (2 slices of whole-wheat bread, 2 servings of most fruits or vegetables, or 1 bowl of high-fiber cereal) per day is best. Too rapid an increase in  fiber may result in constipation, flatulence, and bloating.   Drink enough water and fluids to keep your urine clear or pale yellow. Water, juice, or caffeine-free drinks are recommended. Not drinking enough fluid may cause constipation.   Eat a variety of high-fiber foods rather than one type of fiber.   Try to increase your intake of fiber through using high-fiber foods rather than fiber pills or supplements that contain small amounts of fiber.   The goal is to change the types of food eaten. Do not supplement your present diet with high-fiber foods, but replace foods in your present diet.    INCLUDE A VARIETY OF FIBER SOURCES  Replace refined and processed grains with whole grains, canned fruits with fresh fruits, and incorporate other fiber sources. White rice, white breads, and most bakery goods contain little or no fiber.   Brown whole-grain rice, buckwheat oats, and many fruits and vegetables are all good sources of fiber. These include: broccoli, Brussels sprouts, cabbage, cauliflower, beets, sweet potatoes, white potatoes (skin on), carrots, tomatoes, eggplant, squash, berries, fresh fruits, and dried fruits.   Cereals appear to be the richest source of fiber. Cereal fiber is found in whole grains and bran. Bran is the fiber-rich outer coat of cereal grain, which is largely removed in refining. In whole-grain cereals, the bran remains. In breakfast cereals, the largest amount of fiber is found in those with "bran" in their names. The fiber content is sometimes indicated on the label.   You may need to include additional fruits and vegetables each day.   In baking, for 1 cup  white flour, you may use the following substitutions:   1 cup whole-wheat flour minus 2 tablespoons.   1/2 cup white flour plus 1/2 cup whole-wheat flour.

## 2013-04-01 NOTE — Telephone Encounter (Signed)
Spoke to pt to advise results/instructions. Pt understood.  

## 2013-04-01 NOTE — Telephone Encounter (Signed)
Pt's wife aware.

## 2013-04-05 ENCOUNTER — Ambulatory Visit: Payer: Medicare Other | Admitting: Cardiovascular Disease

## 2013-04-14 ENCOUNTER — Encounter: Payer: Medicare Other | Admitting: Gastroenterology

## 2013-04-14 ENCOUNTER — Telehealth: Payer: Self-pay | Admitting: *Deleted

## 2013-04-14 NOTE — Telephone Encounter (Signed)
Pt is a no show

## 2013-04-15 NOTE — Telephone Encounter (Signed)
REVIEWED.  

## 2013-04-16 ENCOUNTER — Ambulatory Visit: Payer: Medicare Other | Admitting: Cardiovascular Disease

## 2013-04-28 ENCOUNTER — Telehealth: Payer: Self-pay | Admitting: *Deleted

## 2013-04-28 MED ORDER — POLYETHYLENE GLYCOL 3350 17 G PO PACK: PACK | ORAL | Status: DC

## 2013-04-28 NOTE — Telephone Encounter (Signed)
Pt's wife called stating they need another refill for merilax, and protonix, pt would also like his acid pills increased to 3 times a day, if okay with Dr. Darrick Penna. Please advise [phone]

## 2013-04-28 NOTE — Telephone Encounter (Signed)
PLEASE CALL PT. HE CANNOT TAKE PROTONIX MORE THAN BID. I REFILLED HIS MIRALAX. HE WILL NEED TO COME IN SEP 23 AT 2 PM FOR [facility] BANDING.

## 2013-04-28 NOTE — Telephone Encounter (Signed)
Routing to Dr. Fields.  

## 2013-04-29 NOTE — Telephone Encounter (Signed)
Pt is aware of banding OV on 9/23 at 2 with SF and appt card was mailed

## 2013-04-29 NOTE — Telephone Encounter (Signed)
Called and informed pt of meds and Banding appt. He said he got a prescription filled for the protonix yesterday and it was filled for once daily. ( His wife called off the number on an old bottle)  I told him Dr. Darrick Penna sent in Rx on 03/31/2013 for #60 , bid and 11 refills and he needs to check with his pharmacist. He will call if there is a problem.

## 2013-05-18 ENCOUNTER — Encounter: Payer: Self-pay | Admitting: Gastroenterology

## 2013-05-18 ENCOUNTER — Ambulatory Visit (INDEPENDENT_AMBULATORY_CARE_PROVIDER_SITE_OTHER): Payer: Medicare Other | Admitting: Gastroenterology

## 2013-05-18 VITALS — BP 123/86 | HR 99 | Temp 97.6°F | Ht 74.0 in | Wt 205.6 lb

## 2013-05-18 DIAGNOSIS — K220 Achalasia of cardia: Secondary | ICD-10-CM

## 2013-05-18 DIAGNOSIS — R21 Rash and other nonspecific skin eruption: Secondary | ICD-10-CM

## 2013-05-18 DIAGNOSIS — R109 Unspecified abdominal pain: Secondary | ICD-10-CM

## 2013-05-18 DIAGNOSIS — K648 Other hemorrhoids: Secondary | ICD-10-CM

## 2013-05-18 MED ORDER — CLOTRIMAZOLE 1 % EX CREA: TOPICAL_CREAM | CUTANEOUS | Status: DC

## 2013-05-18 NOTE — Assessment & Plan Note (Signed)
R POS BAND TODAY. 4 BANDS PLACED TOTAL.  DRINK WATER EAT FIBER OPV IN 3 MOS

## 2013-05-18 NOTE — Patient Instructions (Signed)
TO REDUCE REGURGITATION AND REFLUX:  1. EAT 4-6 SMALL MEALS DAILY. 2. .CONTINUE PROTONIX. TAKE 30 MINUTES PRIOR TO MEALS TWICE DAILY.   TO PREVENT CONSTIPATION:  1. DRINK WATER TO KEEP YOUR URINE LIGHT YELLOW. 2. FOLLOW A HIGH FIBER/LOW FAT DIET. SEE INFO BELOW. 3. FOLLOW UP IN 3 MOS.    APPLY LOTRIMIN TO RASH ON YOUR BOTTOM AND FOLLOW UP WITH DERMATOLOGY.  Low-Fat Diet BREADS, CEREALS, PASTA, RICE, DRIED PEAS, AND BEANS These products are high in carbohydrates and most are low in fat. Therefore, they can be increased in the diet as substitutes for fatty foods. They too, however, contain calories and should not be eaten in excess. Cereals can be eaten for snacks as well as for breakfast.  Include foods that contain fiber (fruits, vegetables, whole grains, and legumes). Research shows that fiber may lower blood cholesterol levels, especially the water-soluble fiber found in fruits, vegetables, oat products, and legumes. FRUITS AND VEGETABLES It is good to eat fruits and vegetables. Besides being sources of fiber, both are rich in vitamins and some minerals. They help you get the daily allowances of these nutrients. Fruits and vegetables can be used for snacks and desserts. MEATS Limit lean meat, chicken, [place], and fish to no more than 6 ounces per day. Beef, Pork, and Lamb Use lean cuts of beef, pork, and lamb. Lean cuts include:  Extra-lean ground beef.  Arm roast.  Sirloin tip.  Center-cut ham.  Round steak.  Loin chops.  Rump roast.  Tenderloin.  Trim all fat off the outside of meats before cooking. It is not necessary to severely decrease the intake of red meat, but lean choices should be made. Lean meat is rich in protein and contains a highly absorbable form of iron. Premenopausal women, in particular, should avoid reducing lean red meat because this could increase the risk for low red blood cells (iron-deficiency anemia). Processed Meats Processed meats, such as bacon,  bologna, salami, sausage, and hot dogs contain large quantities of fat, are not rich in valuable nutrients, and should not be eaten very often. Organ Meats The organ meats, such as liver, sweetbreads, kidneys, and brain are very rich in cholesterol. They should be limited. Chicken and [place] These are good sources of protein. The fat of poultry can be reduced by removing the skin and underlying fat layers before cooking. Chicken and [place] can be substituted for lean red meat in the diet. Poultry should not be fried or covered with high-fat sauces. Fish and Shellfish Fish is a good source of protein. Shellfish contain cholesterol, but they usually are low in saturated fatty acids. The preparation of fish is important. Like chicken and [place], they should not be fried or covered with high-fat sauces. EGGS Egg yolks often are hidden in cooked and processed foods. Egg whites contain no fat or cholesterol. They can be eaten often. Try 1 to 2 egg whites instead of whole eggs in recipes or use egg substitutes that do not contain yolk. MILK AND DAIRY PRODUCTS Use skim or 1% milk instead of 2% or whole milk. Decrease whole milk, natural, and processed cheeses. Use nonfat or low-fat (2%) cottage cheese or low-fat cheeses made from vegetable oils. Choose nonfat or low-fat (1 to 2%) yogurt. Experiment with evaporated skim milk in recipes that call for heavy cream. Substitute low-fat yogurt or low-fat cottage cheese for sour cream in dips and salad dressings. Have at least 2 servings of low-fat dairy products, such as 2 glasses of skim (  or 1%) milk each day to help get your daily calcium intake. FATS AND OILS Reduce the total intake of fats, especially saturated fat. Butterfat, lard, and beef fats are high in saturated fat and cholesterol. These should be avoided as much as possible. Vegetable fats do not contain cholesterol, but certain vegetable fats, such as coconut oil, palm oil, and palm kernel oil are very  high in saturated fats. These should be limited. These fats are often used in bakery goods, processed foods, popcorn, oils, and nondairy creamers. Vegetable shortenings and some peanut butters contain hydrogenated oils, which are also saturated fats. Read the labels on these foods and check for saturated vegetable oils. Unsaturated vegetable oils and fats do not raise blood cholesterol. However, they should be limited because they are fats and are high in calories. Total fat should still be limited to 30% of your daily caloric intake. Desirable liquid vegetable oils are corn oil, cottonseed oil, olive oil, canola oil, safflower oil, soybean oil, and sunflower oil. Peanut oil is not as good, but small amounts are acceptable. Buy a heart-healthy tub margarine that has no partially hydrogenated oils in the ingredients. Mayonnaise and salad dressings often are made from unsaturated fats, but they should also be limited because of their high calorie and fat content. Seeds, nuts, peanut butter, olives, and avocados are high in fat, but the fat is mainly the unsaturated type. These foods should be limited mainly to avoid excess calories and fat. OTHER EATING TIPS Snacks  Most sweets should be limited as snacks. They tend to be rich in calories and fats, and their caloric content outweighs their nutritional value. Some good choices in snacks are graham crackers, melba toast, soda crackers, bagels (no egg), English muffins, fruits, and vegetables. These snacks are preferable to snack crackers, [place] fries, and chips. Popcorn should be air-popped or cooked in small amounts of liquid vegetable oil. Desserts Eat fruit, low-fat yogurt, and fruit ices instead of pastries, cake, and cookies. Sherbet, angel food cake, gelatin dessert, frozen low-fat yogurt, or other frozen products that do not contain saturated fat (pure fruit juice bars, frozen ice pops) are also acceptable.  COOKING METHODS Choose those methods that use  little or no fat. They include: Poaching.  Braising.  Steaming.  Grilling.  Baking.  Stir-frying.  Broiling.  Microwaving.  Foods can be cooked in a nonstick pan without added fat, or use a nonfat cooking spray in regular cookware. Limit fried foods and avoid frying in saturated fat. Add moisture to lean meats by using water, broth, cooking wines, and other nonfat or low-fat sauces along with the cooking methods mentioned above. Soups and stews should be chilled after cooking. The fat that forms on top after a few hours in the refrigerator should be skimmed off. When preparing meals, avoid using excess salt. Salt can contribute to raising blood pressure in some people. EATING AWAY FROM HOME Order entres, potatoes, and vegetables without sauces or butter. When meat exceeds the size of a deck of cards (3 to 4 ounces), the rest can be taken home for another meal. Choose vegetable or fruit salads and ask for low-calorie salad dressings to be served on the side. Use dressings sparingly. Limit high-fat toppings, such as bacon, crumbled eggs, cheese, sunflower seeds, and olives. Ask for heart-healthy tub margarine instead of butter.  High-Fiber Diet A high-fiber diet changes your normal diet to include more whole grains, legumes, fruits, and vegetables. Changes in the diet involve replacing refined carbohydrates with  unrefined foods. The calorie level of the diet is essentially unchanged. The Dietary Reference Intake (recommended amount) for adult males is 38 grams per day. For adult females, it is 25 grams per day. Pregnant and lactating women should consume 28 grams of fiber per day. Fiber is the intact part of a plant that is not broken down during digestion. Functional fiber is fiber that has been isolated from the plant to provide a beneficial effect in the body. PURPOSE  Increase stool bulk.   Ease and regulate bowel movements.   Lower cholesterol.  INDICATIONS THAT YOU NEED MORE  FIBER  Constipation and hemorrhoids.   Uncomplicated diverticulosis (intestine condition) and irritable bowel syndrome.   Weight management.   As a protective measure against hardening of the arteries (atherosclerosis), diabetes, and cancer.   GUIDELINES FOR INCREASING FIBER IN THE DIET  Start adding fiber to the diet slowly. A gradual increase of about 5 more grams (2 slices of whole-wheat bread, 2 servings of most fruits or vegetables, or 1 bowl of high-fiber cereal) per day is best. Too rapid an increase in fiber may result in constipation, flatulence, and bloating.   Drink enough water and fluids to keep your urine clear or pale yellow. Water, juice, or caffeine-free drinks are recommended. Not drinking enough fluid may cause constipation.   Eat a variety of high-fiber foods rather than one type of fiber.   Try to increase your intake of fiber through using high-fiber foods rather than fiber pills or supplements that contain small amounts of fiber.   The goal is to change the types of food eaten. Do not supplement your present diet with high-fiber foods, but replace foods in your present diet.  INCLUDE A VARIETY OF FIBER SOURCES  Replace refined and processed grains with whole grains, canned fruits with fresh fruits, and incorporate other fiber sources. White rice, white breads, and most bakery goods contain little or no fiber.   Brown whole-grain rice, buckwheat oats, and many fruits and vegetables are all good sources of fiber. These include: broccoli, Brussels sprouts, cabbage, cauliflower, beets, sweet potatoes, white potatoes (skin on), carrots, tomatoes, eggplant, squash, berries, fresh fruits, and dried fruits.   Cereals appear to be the richest source of fiber. Cereal fiber is found in whole grains and bran. Bran is the fiber-rich outer coat of cereal grain, which is largely removed in refining. In whole-grain cereals, the bran remains. In breakfast cereals, the largest amount  of fiber is found in those with "bran" in their names. The fiber content is sometimes indicated on the label.   You may need to include additional fruits and vegetables each day.   In baking, for 1 cup white flour, you may use the following substitutions:   1 cup whole-wheat flour minus 2 tablespoons.   1/2 cup white flour plus 1/2 cup whole-wheat flour.

## 2013-05-18 NOTE — Progress Notes (Signed)
Reminder in epic °

## 2013-05-18 NOTE — Assessment & Plan Note (Signed)
SX FOR > 4 YEARS. MULTIPLE CT SCANS/ENDOSCOPY NAIAP  MODIFY DIET MONITOR SX OPV IN 3 MOS

## 2013-05-18 NOTE — Assessment & Plan Note (Signed)
SX IMPROVED.  LOTRIMIN FOR 10 DAYS DERMATOLOGY REFERRAL OPV IN 3 MOS.

## 2013-05-18 NOTE — Assessment & Plan Note (Signed)
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  EAT 4-6 SMALL MEALS DAILY CONTINUE BID PPI

## 2013-05-18 NOTE — Progress Notes (Addendum)
SYMPTOMS: LAST SEEN JUL 2014: ANOSCOPY REVEALED REDUNDANT TISSUE IN R ANTERIOR AND R POSTERIOR HEMORRHOID BUNDLE. NO REDUNDANT TISSUE IN LEFT LATERAL HEMORRHOID BUNDLE. ERYTHEMA AND DRYNESS IN PER-NEAL AREA. RASH IMPROVED WITH LOTRIMIN BUT NOT RESOLVED. STILL HAVING RECTAL BLEEDING, PRESSURE,  ITCHING, BURNING.  CONSTIPATION: NO DIARRHEA: no  STRAINS WITH BMs: YES  TIME SPENT ON TOILET: 10 MINS TISSUE POKES OUT OF RECTUM: no FIBER SUPPLEMENTS: NO  GLASSES OF WATER/DAY: 6-8-yes   ADDITIONAL QUESTIONS:  LATEX ALLERGY: NO PREGNANT: NO ERECTILE DYSFUNCTION MEDS OR NITRATES: NO ANTICOAGULATION/ANTIPLATELET MEDS: NO DIAGNOSED WITH CROHN'S DISEASE, PROCTITIS, PORTAL HTN, OR ANAL/RECTAL CA: NO TAKING IMMUNOSUPPRESSANTS/XRT: NO   PLAN: 1. R POSTERIOR BAND TODAY.   PROCEDURE: [facility] BANDING  PROCEDURE TECHNIQUE: BENEFITS RISK EXPLAINED TO PT. SCOPE PLACED.  ONE [facility] BAND PLACED IN RIGHT POSTERIOR POSITION. POST-BANDING RECTAL EXAM REVEALED GOOD PLACEMENT. EXAM NON-TENDER.

## 2013-05-20 NOTE — Progress Notes (Signed)
Patient has an appointment with Dr. Andi Devon Dermatologist on October 2nd at 11:00

## 2013-05-24 ENCOUNTER — Telehealth: Payer: Self-pay | Admitting: Gastroenterology

## 2013-05-24 NOTE — Telephone Encounter (Signed)
Pt's wife called asking about pt's protonix being called into [facility] in [place] for a 60 day supply. Please advise

## 2013-05-24 NOTE — Telephone Encounter (Signed)
Dr. Evelina Dun had sent prescription for pantoprozole 40 mg bid in August 2014, with 11 refills. Pt wanted 60 days at the time and I called and spoke to the pharmacist, Diane, at [facility] and she will do that with 5 refills.

## 2013-05-26 NOTE — Telephone Encounter (Signed)
REVIEWED. AGREE. 

## 2013-06-21 ENCOUNTER — Other Ambulatory Visit: Payer: Self-pay

## 2013-06-21 MED ORDER — PROMETHAZINE HCL 25 MG PO TABS: 25.0000 mg | ORAL_TABLET | Freq: Four times a day (QID) | ORAL | Status: DC | PRN

## 2013-06-30 IMAGING — RF DG ESOPHAGUS
16 of 24 series · 16 of 24 positions shown · non-contrast
Comparison: 05/08/2011

CLINICAL DATA: Severe dysphagia, history achalasia with prior
dilatation

BARIUM SWALLOW/ESOPHAGRAM:
TECHNIQUE: Single contrast, air contrast, and tablet imaging of
the esophagus were performed.
Fluoroscopy time:  2.7 minutes

[Series 1: run · 1 of 1 slices shown (1 of 16)]
[im 1/1]
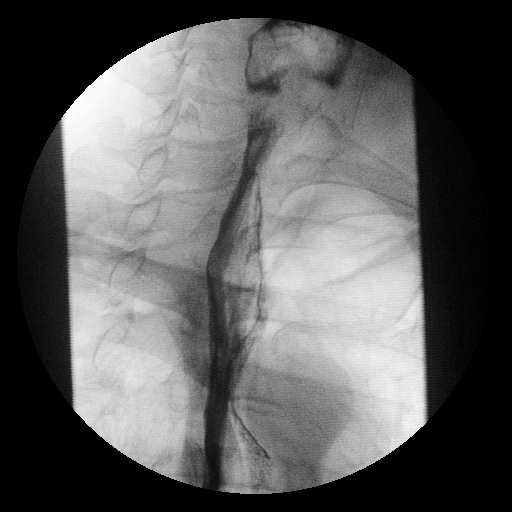

[Series 3: run · 1 of 1 slices shown (2 of 16)]
[im 1/1]
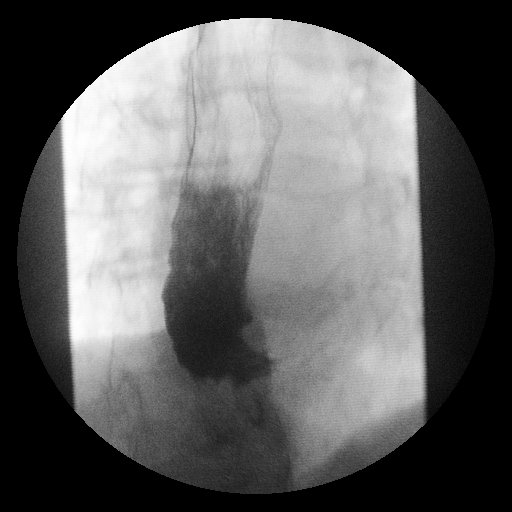

[Series 4: run · 1 of 1 slices shown (3 of 16)]
[im 1/1]
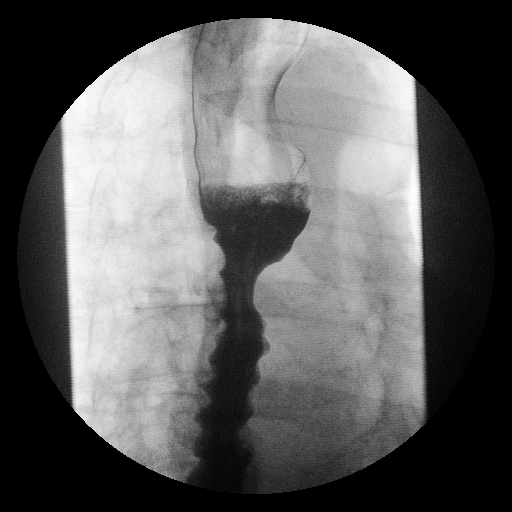

[Series 6: run · 1 of 1 slices shown (4 of 16)]
[im 1/1]
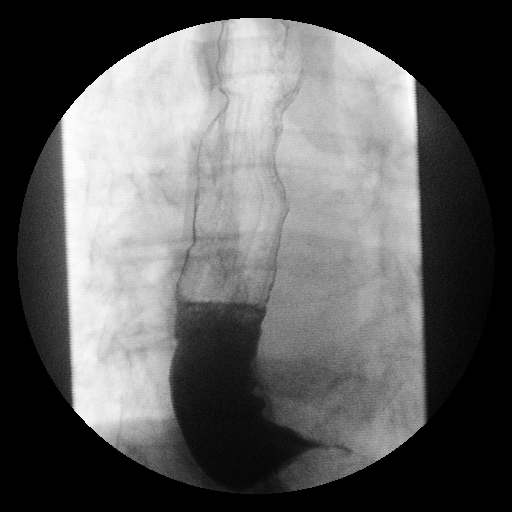

[Series 7: run · 1 of 1 slices shown (5 of 16)]
[im 1/1]
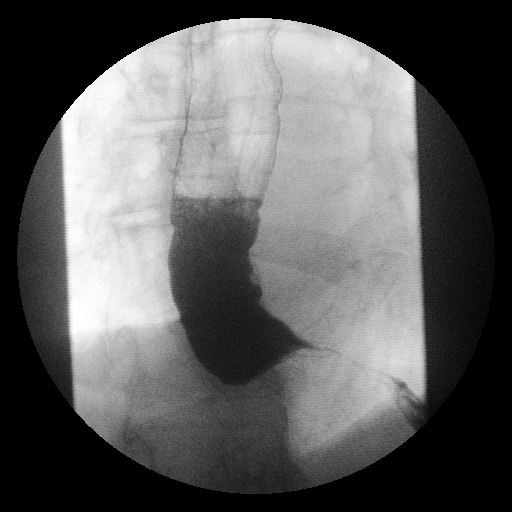

[Series 9: run · 1 of 1 slices shown (6 of 16)]
[im 1/1]
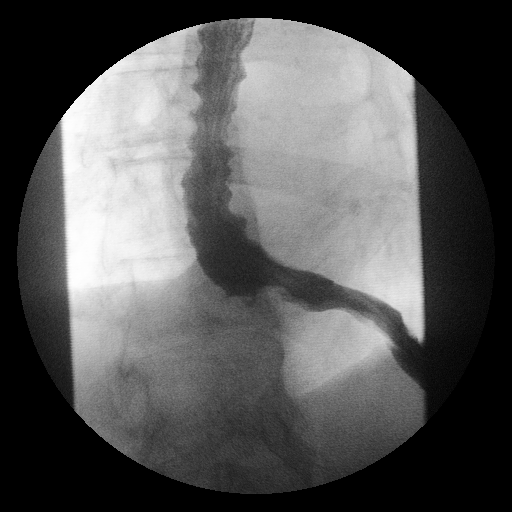

[Series 10: run · 1 of 1 slices shown (7 of 16)]
[im 1/1]
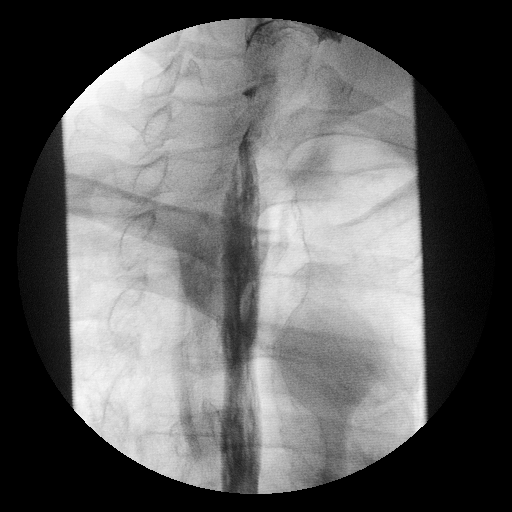

[Series 12: run · 1 of 1 slices shown (8 of 16)]
[im 1/1]
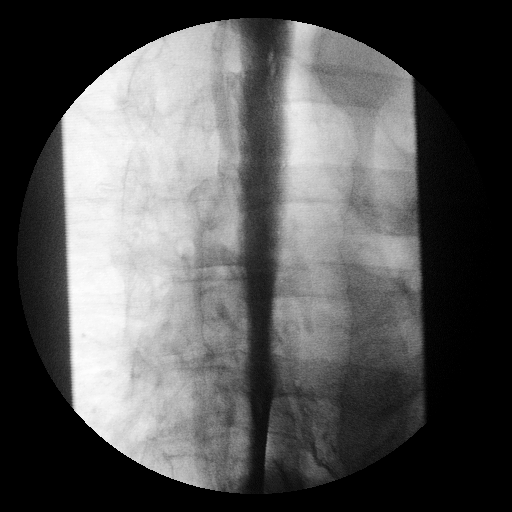

[Series 13: run · 1 of 1 slices shown (9 of 16)]
[im 1/1]
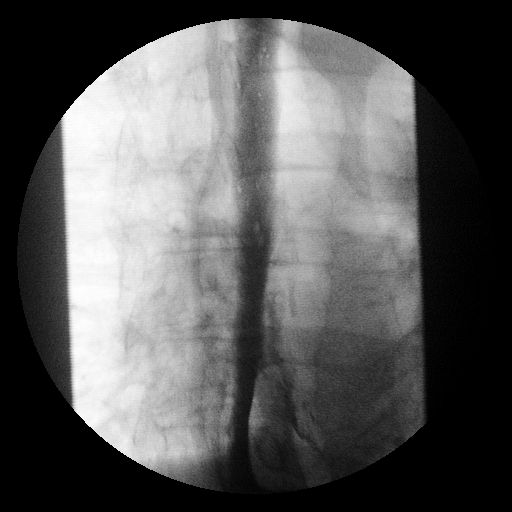

[Series 15: run · 1 of 1 slices shown (10 of 16)]
[im 1/1]
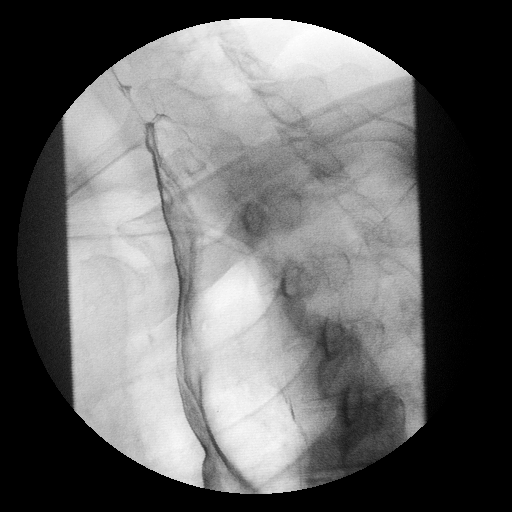

[Series 16: run · 1 of 1 slices shown (11 of 16)]
[im 1/1]
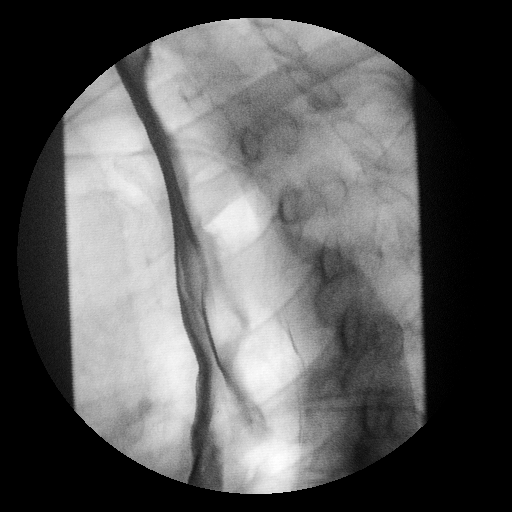

[Series 18: run · 1 of 1 slices shown (12 of 16)]
[im 1/1]
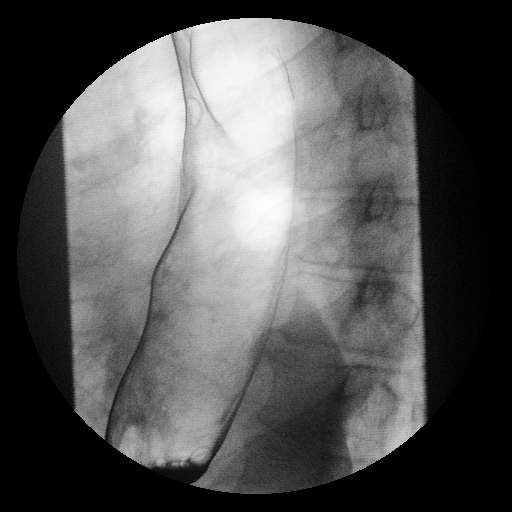

[Series 19: run · 1 of 1 slices shown (13 of 16)]
[im 1/1]
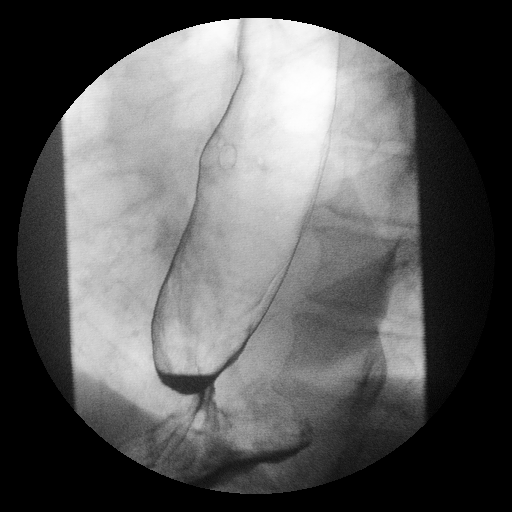

[Series 21: run · 1 of 1 slices shown (14 of 16)]
[im 1/1]
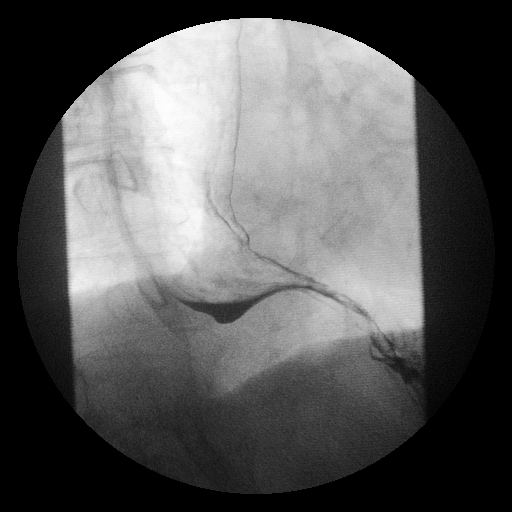

[Series 22: run · 1 of 2 slices shown (15 of 16)]
[im 1/2]
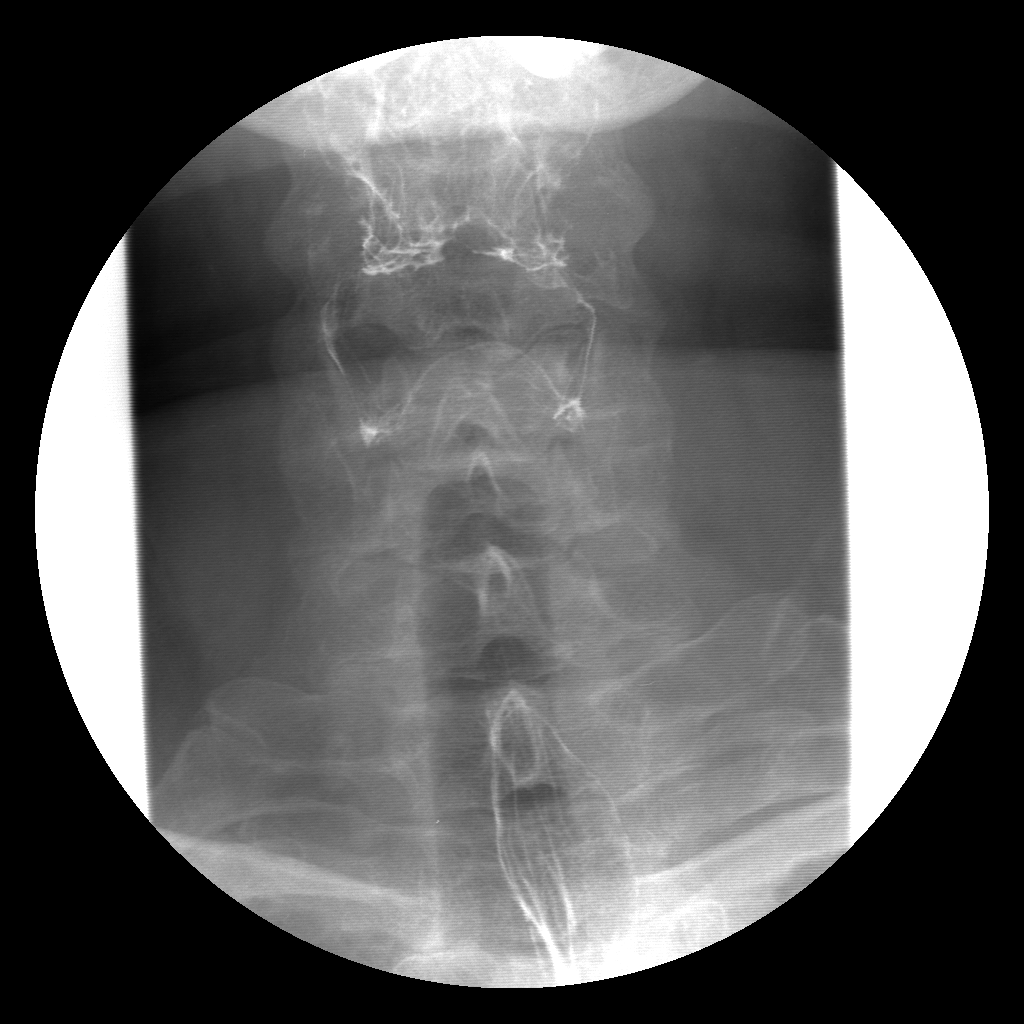

[Series 24: run · 1 of 1 slices shown (16 of 16)]
[im 1/1]
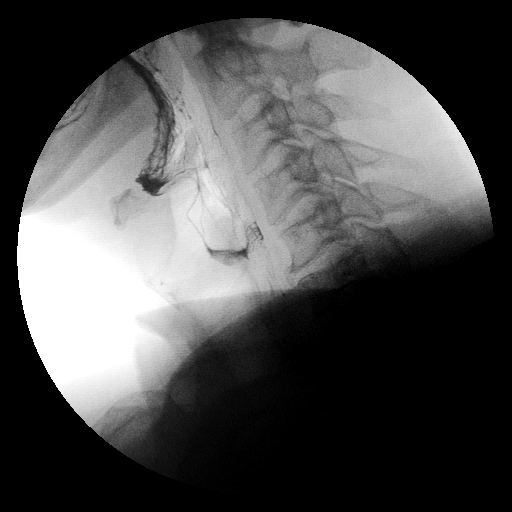

[16 of 24 positions shown; findings below may reference images not displayed]

FINDINGS: Dilated hypotonic thoracic esophagus.
Poor propagation of primary peristaltic waves.
Numerous tertiary waves identified.
Contrast passage to the distal esophagus is primarily gravity
dependent.
Tapering of the distal esophagus to a point at the gastroesophageal
junction, appearance most consistent with achalasia.
No definite areas of wall irregularity or mass.
No persistent intraluminal filling defects.

Passage of fluid across the narrowed gastroesophageal junction is
enhanced by a taller fluid column within the distal esophagus.
A 12.5 mm diameter barium tablet obstructed at the narrowed
gastroesophageal junction and dissolved in place.
RAO prone images were not performed.
Targeted rapid sequence imaging of the cervical esophagus
hypopharynx is unremarkable.
The patient vomited multiple times at the end of the exam.
IMPRESSION: High grade tapered narrowing of the distal esophagus to the
gastroesophageal junction, with dilatation and hypotonia of the
thoracic esophagus, consistent with achalasia.
Obstruction of a 12.5 mm diameter barium tablet at the GE junction.

Findings discussed with Dr. Delmar prior to dictation of this
report.

## 2013-07-21 ENCOUNTER — Encounter: Payer: Self-pay | Admitting: Gastroenterology

## 2013-08-02 ENCOUNTER — Other Ambulatory Visit: Payer: Self-pay

## 2013-08-04 MED ORDER — PROMETHAZINE HCL 25 MG PO TABS: 25.0000 mg | ORAL_TABLET | Freq: Four times a day (QID) | ORAL | Status: DC | PRN

## 2013-08-11 ENCOUNTER — Other Ambulatory Visit: Payer: Self-pay

## 2013-08-11 MED ORDER — CLOTRIMAZOLE 1 % EX CREA: TOPICAL_CREAM | CUTANEOUS | Status: DC

## 2013-09-01 ENCOUNTER — Telehealth: Payer: Self-pay | Admitting: Cardiovascular Disease

## 2013-09-01 MED ORDER — METOPROLOL SUCCINATE ER 25 MG PO TB24: 25.0000 mg | ORAL_TABLET | Freq: Every day | ORAL | Status: AC

## 2013-09-01 NOTE — Telephone Encounter (Signed)
Received fax refill request  Rx # [id number] Medication:  Metoprolol ER 25 mg tab Qty 90 Sig:  Take one tablet by mouth once daily Physician:  Bronson Ing

## 2013-09-01 NOTE — Telephone Encounter (Signed)
Medication sent via escribe.  

## 2013-09-29 ENCOUNTER — Other Ambulatory Visit: Payer: Self-pay

## 2013-09-29 MED ORDER — PROMETHAZINE HCL 25 MG PO TABS: 25.0000 mg | ORAL_TABLET | Freq: Four times a day (QID) | ORAL | Status: DC | PRN

## 2013-10-28 ENCOUNTER — Other Ambulatory Visit (HOSPITAL_COMMUNITY): Payer: Self-pay | Admitting: Family Medicine

## 2013-10-28 ENCOUNTER — Ambulatory Visit (HOSPITAL_COMMUNITY)
Admission: RE | Admit: 2013-10-28 | Discharge: 2013-10-28 | Disposition: A | Discharge status: Home / Self Care | Payer: Medicare Other | Source: Ambulatory Visit | Attending: Family Medicine | Admitting: Family Medicine

## 2013-10-28 DIAGNOSIS — I700 Atherosclerosis of aorta: Secondary | ICD-10-CM | POA: Insufficient documentation

## 2013-10-28 DIAGNOSIS — M545 Low back pain, unspecified: Secondary | ICD-10-CM | POA: Insufficient documentation

## 2014-01-25 ENCOUNTER — Other Ambulatory Visit: Payer: Self-pay | Admitting: *Deleted

## 2014-01-31 ENCOUNTER — Other Ambulatory Visit: Payer: Self-pay | Admitting: *Deleted

## 2014-01-31 MED ORDER — LIDOCAINE VISCOUS 2 % MT SOLN: OROMUCOSAL | Status: DC

## 2014-03-21 ENCOUNTER — Other Ambulatory Visit: Payer: Self-pay | Admitting: Gastroenterology

## 2014-04-18 ENCOUNTER — Other Ambulatory Visit: Payer: Self-pay

## 2014-04-18 MED ORDER — LINACLOTIDE 290 MCG PO CAPS: 290.0000 ug | ORAL_CAPSULE | Freq: Every day | ORAL | Status: DC

## 2014-04-28 ENCOUNTER — Other Ambulatory Visit: Payer: Self-pay

## 2014-04-30 ENCOUNTER — Emergency Department (HOSPITAL_COMMUNITY): Payer: Medicare Other

## 2014-04-30 ENCOUNTER — Emergency Department (HOSPITAL_COMMUNITY)
Admission: EM | Admit: 2014-04-30 | Discharge: 2014-04-30 | Disposition: A | Discharge status: Home / Self Care | Payer: Medicare Other | Attending: Emergency Medicine | Admitting: Emergency Medicine

## 2014-04-30 ENCOUNTER — Encounter (HOSPITAL_COMMUNITY): Payer: Self-pay | Admitting: Emergency Medicine

## 2014-04-30 DIAGNOSIS — E7800 Pure hypercholesterolemia, unspecified: Secondary | ICD-10-CM | POA: Insufficient documentation

## 2014-04-30 DIAGNOSIS — Z8739 Personal history of other diseases of the musculoskeletal system and connective tissue: Secondary | ICD-10-CM | POA: Diagnosis not present

## 2014-04-30 DIAGNOSIS — Z87891 Personal history of nicotine dependence: Secondary | ICD-10-CM | POA: Diagnosis not present

## 2014-04-30 DIAGNOSIS — G8929 Other chronic pain: Secondary | ICD-10-CM | POA: Diagnosis not present

## 2014-04-30 DIAGNOSIS — R112 Nausea with vomiting, unspecified: Secondary | ICD-10-CM | POA: Insufficient documentation

## 2014-04-30 DIAGNOSIS — J449 Chronic obstructive pulmonary disease, unspecified: Secondary | ICD-10-CM | POA: Insufficient documentation

## 2014-04-30 DIAGNOSIS — Z79899 Other long term (current) drug therapy: Secondary | ICD-10-CM | POA: Diagnosis not present

## 2014-04-30 DIAGNOSIS — R1013 Epigastric pain: Secondary | ICD-10-CM | POA: Insufficient documentation

## 2014-04-30 DIAGNOSIS — R0789 Other chest pain: Secondary | ICD-10-CM | POA: Diagnosis not present

## 2014-04-30 DIAGNOSIS — R079 Chest pain, unspecified: Secondary | ICD-10-CM | POA: Insufficient documentation

## 2014-04-30 DIAGNOSIS — F411 Generalized anxiety disorder: Secondary | ICD-10-CM | POA: Diagnosis not present

## 2014-04-30 DIAGNOSIS — IMO0002 Reserved for concepts with insufficient information to code with codable children: Secondary | ICD-10-CM | POA: Diagnosis not present

## 2014-04-30 DIAGNOSIS — J4489 Other specified chronic obstructive pulmonary disease: Secondary | ICD-10-CM | POA: Insufficient documentation

## 2014-04-30 DIAGNOSIS — E119 Type 2 diabetes mellitus without complications: Secondary | ICD-10-CM | POA: Diagnosis not present

## 2014-04-30 HISTORY — DX: Type 2 diabetes mellitus without complications: E11.9

## 2014-04-30 LAB — COMPREHENSIVE METABOLIC PANEL
ALBUMIN: 3.6 g/dL (ref 3.5–5.2)
ALT: 19 U/L (ref 0–53)
ANION GAP: 11 (ref 5–15)
AST: 22 U/L (ref 0–37)
Alkaline Phosphatase: 79 U/L (ref 39–117)
BUN: 17 mg/dL (ref 6–23)
CALCIUM: 9.1 mg/dL (ref 8.4–10.5)
CHLORIDE: 100 meq/L (ref 96–112)
CO2: 29 mEq/L (ref 19–32)
Creatinine, Ser: 1.42 mg/dL — ABNORMAL HIGH (ref 0.50–1.35)
GFR calc Af Amer: 61 mL/min — ABNORMAL LOW (ref >90)
GFR calc non Af Amer: 52 mL/min — ABNORMAL LOW (ref >90)
Glucose, Bld: 107 mg/dL — ABNORMAL HIGH (ref 70–99)
Potassium: 3.9 mEq/L (ref 3.7–5.3)
Sodium: 140 mEq/L (ref 137–147)
Total Bilirubin: 0.4 mg/dL (ref 0.3–1.2)
Total Protein: 7.8 g/dL (ref 6.0–8.3)

## 2014-04-30 LAB — CBC
HEMATOCRIT: 42.9 % (ref 39.0–52.0)
Hemoglobin: 14.7 g/dL (ref 13.0–17.0)
MCH: 30.6 pg (ref 26.0–34.0)
MCHC: 34.3 g/dL (ref 30.0–36.0)
MCV: 89.2 fL (ref 78.0–100.0)
PLATELETS: 245 10*3/uL (ref 150–400)
RBC: 4.81 MIL/uL (ref 4.22–5.81)
RDW: 13 % (ref 11.5–15.5)
WBC: 8.2 10*3/uL (ref 4.0–10.5)

## 2014-04-30 LAB — PROTIME-INR
INR: 1.02 (ref 0.00–1.49)
Prothrombin Time: 13.4 seconds (ref 11.6–15.2)

## 2014-04-30 LAB — D-DIMER, QUANTITATIVE (NOT AT ARMC): D-Dimer, Quant: 2.07 ug/mL-FEU — ABNORMAL HIGH (ref 0.00–0.48)

## 2014-04-30 LAB — MAGNESIUM: Magnesium: 2.2 mg/dL (ref 1.5–2.5)

## 2014-04-30 LAB — LIPASE, BLOOD: Lipase: 39 U/L (ref 11–59)

## 2014-04-30 LAB — TROPONIN I: Troponin I: <0.3 ng/mL (ref <0.30)

## 2014-04-30 LAB — PRO B NATRIURETIC PEPTIDE: PRO B NATRI PEPTIDE: 29.9 pg/mL (ref 0–125)

## 2014-04-30 MED ORDER — MORPHINE SULFATE 4 MG/ML IJ SOLN
4.0000 mg | Freq: Once | INTRAMUSCULAR | Status: AC
  Administered 2014-04-30: 4 mg via INTRAVENOUS
  Filled 2014-04-30: qty 1

## 2014-04-30 MED ORDER — ONDANSETRON HCL 4 MG/2ML IJ SOLN
4.0000 mg | Freq: Once | INTRAMUSCULAR | Status: AC
  Administered 2014-04-30: 4 mg via INTRAVENOUS
  Filled 2014-04-30: qty 2

## 2014-04-30 MED ORDER — IOHEXOL 350 MG/ML SOLN
100.0000 mL | Freq: Once | INTRAVENOUS | Status: AC | PRN
  Administered 2014-04-30: 100 mL via INTRAVENOUS

## 2014-04-30 MED ORDER — SODIUM CHLORIDE 0.9 % IV BOLUS (SEPSIS)
1000.0000 mL | Freq: Once | INTRAVENOUS | Status: AC
  Administered 2014-04-30: 1000 mL via INTRAVENOUS

## 2014-04-30 MED ORDER — LIDOCAINE VISCOUS 2 % MT SOLN: 20.0000 mL | OROMUCOSAL | Status: DC | PRN

## 2014-04-30 MED ORDER — LIDOCAINE VISCOUS 2 % MT SOLN
15.0000 mL | Freq: Once | OROMUCOSAL | Status: AC
  Administered 2014-04-30: 15 mL via OROMUCOSAL
  Filled 2014-04-30: qty 15

## 2014-04-30 MED ORDER — OXYCODONE-ACETAMINOPHEN 7.5-325 MG PO TABS: 1.0000 | ORAL_TABLET | Freq: Four times a day (QID) | ORAL | Status: DC | PRN

## 2014-04-30 MED ORDER — SODIUM CHLORIDE 0.9 % IV SOLN
Freq: Once | INTRAVENOUS | Status: AC
  Administered 2014-04-30: 16:00:00 via INTRAVENOUS

## 2014-04-30 MED ORDER — METHYLPREDNISOLONE SODIUM SUCC 125 MG IJ SOLR
125.0000 mg | INTRAMUSCULAR | Status: AC
  Administered 2014-04-30: 125 mg via INTRAVENOUS
  Filled 2014-04-30: qty 2

## 2014-04-30 NOTE — ED Provider Notes (Signed)
CSN: [id number]     Arrival date & time 04/30/14  1513 History   First MD Initiated Contact with Patient 04/30/14 1520     Chief Complaint  Patient presents with  . Chest Pain      HPI  Patient presents with chest pain. This episode began several days ago, and is described as epigastric and sternal discomfort, burning, sore, severe. There is associated nausea with vomiting, as well as loose stool. No fevers, chills, dyspnea, cough. He does endorse ongoing fatigue, though this began long ago Since onset of his pain, nausea, no attempts at relief with anything, and is no clear alleviating or exacerbating factors. Patient endorses a history of achalasia, as well as COPD. He denies a history of myocardial ischemia  Past Medical History  Diagnosis Date  . DDD (degenerative disc disease)   . GERD (gastroesophageal reflux disease)   . COPD (chronic obstructive pulmonary disease)   . Hypercholesterolemia   . Anxiety   . Achalasia 2008 168 LBS  . Chronic abdominal pain     AUG 2010 CT ABD W/ IVC-NL PANCREAS, GB, LIVER  . Hypercholesteremia   . Dysphagia    Past Surgical History  Procedure Laterality Date  . Heller myotomy  DEC 2008 DR. CARL WESTCOTT  . Esophagogastroduodenoscopy       MAR 2012 ([facility]) TTS DIL 20 MM, JAN 2010 Gastritis  . Hemorrhoid surgery    . Colonoscopy  2009 [facility]    Normal  . Savory dilation  05/03/2011    Stricture/mild gastritis  . Esophagogastroduodenoscopy N/A 03/01/2013    IR[facility]:VELFYBO body of esophagus secondary to achalasia/Ulcerative reflux esophagitis felt to be source of patient's upper GI bleed/Erosive gastroduodenitis. NORMAL H.plyori serologies  . Colonoscopy N/A 03/19/2013    Procedure: COLONOSCOPY;  Surgeon: Danie Binder, MD;  Location: [facility];  Service: Endoscopy;  Laterality: N/A;  11:15   Family History  Problem Relation Age of Onset  . Colon cancer Neg Hx   . Colon polyps Neg Hx   . Anesthesia problems Neg Hx   . Hypotension Neg Hx     . Malignant hyperthermia Neg Hx   . Pseudochol deficiency Neg Hx   . Cancer      family history  . Heart attack      family history   History  Substance Use Topics  . Smoking status: Former Smoker -- 1.00 packs/day    Types: Cigarettes    Quit date: 08/27/1995  . Smokeless tobacco: Current User    Types: Chew  . Alcohol Use: No    Review of Systems  Constitutional:       Per HPI, otherwise negative  HENT:       Per HPI, otherwise negative  Respiratory:       Per HPI, otherwise negative  Cardiovascular:       Per HPI, otherwise negative  Gastrointestinal: Positive for nausea and vomiting.  Endocrine:       Negative aside from HPI  Genitourinary:       Neg aside from HPI   Musculoskeletal:       Per HPI, otherwise negative  Skin: Negative.   Neurological: Negative for syncope.      Allergies  Dairy aid  Home Medications   Prior to Admission medications   Medication Sig Start Date End Date Taking? Authorizing Provider  albuterol (PROVENTIL) (2.5 MG/3ML) 0.083% nebulizer solution Take 2.5 mg by nebulization every 6 (six) hours as needed for shortness of breath.  Historical Provider, MD  amitriptyline (ELAVIL) 75 MG tablet Take 75 mg by mouth at bedtime.    Historical Provider, MD  clotrimazole (LOTRIMIN) 1 % cream USE ON SKIN AROUND YOUR RECTUM FOR 10 DAYS 08/11/13   Mahala Menghini, PA-C  DULoxetine (CYMBALTA) 60 MG capsule Take 60 mg by mouth daily.      Historical Provider, MD  fluticasone (FLONASE) 50 MCG/ACT nasal spray Place 1 spray into the nose daily as needed for rhinitis or allergies.  11/24/12   Historical Provider, MD  lidocaine (XYLOCAINE) 2 % solution TAKE TWO TEASPOONSFUL OF SOLUTION BY MOUTH EVERY 4 TO 6 HOURS AS NEEDED FOR CHEST OR STOMACH PAIN, NO MORE THAN 8 DOSES DAILY    Mahala Menghini, PA-C  Linaclotide (LINZESS) 290 MCG CAPS capsule Take 1 capsule (290 mcg total) by mouth daily. 30 minutes before breakfast 04/18/14   Orvil Feil, NP   Linaclotide 290 MCG CAPS Take 1 capsule by mouth daily. 03/09/13   Orvil Feil, NP  LORazepam (ATIVAN) 2 MG tablet Take 2 mg by mouth at bedtime as needed for anxiety (sleep).     Historical Provider, MD  metoprolol succinate (TOPROL-XL) 25 MG 24 hr tablet Take 1 tablet (25 mg total) by mouth daily. 09/01/13   Herminio Commons, MD  oxyCODONE-acetaminophen (PERCOCET) 7.5-325 MG per tablet Take 1 tablet by mouth every 6 (six) hours as needed for pain.  10/10/11   Historical Provider, MD  pantoprazole (PROTONIX) 40 MG tablet Take 1 tablet (40 mg total) by mouth 2 (two) times daily. 03/31/13   Danie Binder, MD  polyethylene glycol (MIRALAX / GLYCOLAX) packet USE AS DIRECTED 04/28/13   Danie Binder, MD  pravastatin (PRAVACHOL) 40 MG tablet Take 40 mg by mouth daily.     Historical Provider, MD  promethazine (PHENERGAN) 25 MG tablet TAKE ONE TABLET BY MOUTH EVERY 6 HOURS AS NEEDED FOR NAUSEA    Mahala Menghini, PA-C  QUEtiapine (SEROQUEL) 400 MG tablet Take 400 mg by mouth at bedtime.  11/24/12   Historical Provider, MD  sucralfate (CARAFATE) 1 GM/10ML suspension Take 10 mLs (1 g total) by mouth 4 (four) times daily -  with meals and at bedtime. 03/02/13   Maricela Curet, MD  testosterone (ANDROGEL) 50 MG/5GM GEL Place 5 g onto the skin daily.    Historical Provider, MD   BP 102/79  Pulse 70  Temp(Src) 98.2 F (36.8 C) (Oral)  Resp 20  Ht 6\' 1"  (1.854 m)  Wt 214 lb (97.07 kg)  BMI 28.24 kg/m2  SpO2 100% Physical Exam  Nursing note and vitals reviewed. Constitutional: He is oriented to person, place, and time. He appears well-developed. No distress.  HENT:  Head: Normocephalic and atraumatic.  Eyes: Conjunctivae and EOM are normal.  Cardiovascular: Normal rate, regular rhythm and intact distal pulses.   Pulmonary/Chest: Effort normal. No stridor. No respiratory distress.    Abdominal: He exhibits no distension. There is tenderness in the epigastric area.  Musculoskeletal: He exhibits no  edema.  Neurological: He is alert and oriented to person, place, and time.  Skin: Skin is warm and dry.  Psychiatric: He has a normal mood and affect.    ED Course  Procedures (including critical care time) Labs Review Labs Reviewed  COMPREHENSIVE METABOLIC PANEL - Abnormal; Notable for the following:    Glucose, Bld 107 (*)    Creatinine, Ser 1.42 (*)    GFR calc non Af Amer 52 (*)  GFR calc Af Amer 61 (*)    All other components within normal limits  CBC  PRO B NATRIURETIC PEPTIDE  MAGNESIUM  PROTIME-INR  TROPONIN I  LIPASE, BLOOD    Imaging Review Dg Chest 2 View  04/30/2014   CLINICAL DATA:  Chest pain for several days, shortness of breath, vomiting blood and passing blood in stool. History of esophageal achalasia.  EXAM: CHEST  2 VIEW  COMPARISON:  03/23/2013; 02/27/2013; CT abdomen pelvis - 03/02/2013  FINDINGS: Grossly unchanged borderline enlarged cardiac silhouette and mediastinal contours. The lungs remain hyperexpanded. No focal airspace opacities. No pleural effusion or pneumothorax. No evidence of edema. No acute osseus abnormalities. Regional soft tissues appear normal.  IMPRESSION: Hyperexpanded lungs without acute cardiopulmonary disease.   Electronically Signed   By: Sandi Mariscal M.D.   On: 04/30/2014 16:12     EKG Interpretation   Date/Time:  Saturday April 30 2014 15:23:50 EDT Ventricular Rate:  103 PR Interval:  154 QRS Duration: 100 QT Interval:  326 QTC Calculation: 427 R Axis:   118 Text Interpretation:  Sinus tachycardia Probable left atrial enlargement  Right axis deviation Baseline wander in lead(s) V4 V5 Sinus tachycardia  Artifact Abnormal ekg Confirmed by Carmin Muskrat  MD [phone]) on 04/30/2014  3:31:50 PM     5:37 PM Patient awake and alert, hemodynamically stable. Patient has no new complaints, though he continues to feel mild abdominal discomfort.  7:58 PM Patient continues to have mild epigastric pain radiating to the back.  The  second troponin is normal, reassuring for the low suspicion of ongoing coronary ischemia.  However, the patient's d-dimer was positive. The patient has had 2 L of fluid, and although his GFR is suboptimal, I discussed this case with our radiologist confirmed that the patient is an appropriate candidate for CT study.  9:12 PM Patient sitting upright, in no pain, and in no distress. I had a lengthy conversation with the patient and his wife about all findings, reassuring results. Patient has a followup appointment scheduled in 72 hours with his primary care physician.   MDM   Patient presents with ongoing epigastric, chest pain with radiation to the back.  Patient's evaluation is largely reassuring for the absence of [employer] nonischemic EKG, the serial negative troponin. Patient has soft, no peritoneal abdomen, and there is low suspicion for peritonitis. Patient has no right upper quadrant pain, reassuring for the low suspicion of hepatobiliary dysfunction. After the patient's pain persisted, with radiation to the back, d-dimer was performed, was positive, and the patient's CT study. I discussed the patient's renal function with our radiologist study and in the patient received 2 L IV fluids. Patient's study was notable for the absence of PE or dissection. Patient's pain resolved entirely her. Patient has followup scheduled already, was discharged with additional analgesics, return precautions to     Carmin Muskrat, MD 04/30/14 2113

## 2014-04-30 NOTE — Discharge Instructions (Signed)
As discussed, your evaluation today has been largely reassuring.  But, it is important that you monitor your condition carefully, and do not hesitate to return to the ED if you develop new, or concerning changes in your condition. ? ?Otherwise, please follow-up with your physician for appropriate ongoing care. ? ?

## 2014-04-30 NOTE — ED Notes (Signed)
Requesting pain medication. MD aware. Awaiting orders.

## 2014-04-30 NOTE — ED Notes (Addendum)
Chest pain and vomiting  up blood for several days.

## 2014-05-03 ENCOUNTER — Ambulatory Visit (INDEPENDENT_AMBULATORY_CARE_PROVIDER_SITE_OTHER): Payer: Medicare Other | Admitting: Gastroenterology

## 2014-05-03 ENCOUNTER — Telehealth: Payer: Self-pay | Admitting: Gastroenterology

## 2014-05-03 VITALS — BP 116/74 | HR 80 | Temp 97.8°F | Ht 73.0 in | Wt 204.8 lb

## 2014-05-03 DIAGNOSIS — R1011 Right upper quadrant pain: Secondary | ICD-10-CM

## 2014-05-03 DIAGNOSIS — R112 Nausea with vomiting, unspecified: Secondary | ICD-10-CM | POA: Insufficient documentation

## 2014-05-03 DIAGNOSIS — K220 Achalasia of cardia: Secondary | ICD-10-CM

## 2014-05-03 DIAGNOSIS — R1115 Cyclical vomiting syndrome unrelated to migraine: Secondary | ICD-10-CM

## 2014-05-03 DIAGNOSIS — R1110 Vomiting, unspecified: Secondary | ICD-10-CM

## 2014-05-03 DIAGNOSIS — R1319 Other dysphagia: Secondary | ICD-10-CM

## 2014-05-03 NOTE — Patient Instructions (Signed)
1. Please sip water or other fluid every 15 minutes to keep your urine color light yellow. 2. Please have your swallowing test and ultrasound done. We will call you with results.  3. Please call us if your symptoms worsen.

## 2014-05-03 NOTE — Progress Notes (Signed)
Primary Care Physician:  Maricela Curet, MD  Primary Gastroenterologist:  Barney Drain, MD   Chief Complaint  Patient presents with  . Emesis  . Abdominal Pain  . Dysphagia    HPI:  Walter Herring is a 59 y.o. male here for same-day office visit for abdominal pain, vomiting, difficulty swallowing. He has a history of achalasia status post Heller's myotomy in 2008 or 2009. Also history of chronic abdominal pain and anxiety. He was reevaluated at some point and suggested to have redo of his myotomy by Dr. Abran Richard, the patient declined. Last EGD was by Dr. Laural Golden during hospitalization for upper GI bleed 2014. He had dilated body of the esophagus, scant amount of clear fluid, distal narrowing but the scope easily passed. 3 ulcers noted in the distal esophagus. Few prepyloric or erosions. No dilation was performed at that time. However back in 2002 his esophagus was dilated to 17 mm with savory dilator.  Patient reports dysphagia getting worse. Hurts to swallow. Vomiting daily. Bread comes up. Jello came up. Sometimes cannot even drink water. No heartburn. Epigastric/right upper quadrant burning especially with meals. Pressure in chest. BM very slow. May go a couple of days without a bowel movement.  Feels Linzess works better than Miralax. Few days ago coffee ground emesis with some pink blood. Stools black then but now normal. Seen in the emergency department. Hemoglobin 14.7. MI ruled out. CTA unremarkable except for fluid-filled esophagus. Last episode of vomiting was last night. Complains of fatigue. Goody's powders rarely for headache.   Current Outpatient Prescriptions  Medication Sig Dispense Refill  . albuterol (PROVENTIL) (2.5 MG/3ML) 0.083% nebulizer solution Take 2.5 mg by nebulization every 6 (six) hours as needed for shortness of breath.       Marland Kitchen amitriptyline (ELAVIL) 75 MG tablet Take 75 mg by mouth at bedtime.      . DULoxetine (CYMBALTA) 60 MG capsule Take 60 mg by mouth daily.         . fluticasone (FLONASE) 50 MCG/ACT nasal spray Place 1 spray into the nose daily as needed for rhinitis or allergies.       . Linaclotide (LINZESS) 290 MCG CAPS capsule Take 290 mcg by mouth daily as needed (for stomach).      . LORazepam (ATIVAN) 2 MG tablet Take 2 mg by mouth at bedtime as needed for anxiety (sleep).       . meclizine (ANTIVERT) 25 MG tablet Take 25 mg by mouth 2 (two) times daily.       . metFORMIN (GLUCOPHAGE) 500 MG tablet Take 500 mg by mouth 2 (two) times daily.      . metoprolol succinate (TOPROL-XL) 25 MG 24 hr tablet Take 1 tablet (25 mg total) by mouth daily.  90 tablet  1  . oxyCODONE-acetaminophen (PERCOCET) 10-325 MG per tablet Take 1 tablet by mouth every 6 (six) hours as needed.       . pantoprazole (PROTONIX) 40 MG tablet Take 1 tablet (40 mg total) by mouth 2 (two) times daily.  60 tablet  11  . pravastatin (PRAVACHOL) 40 MG tablet Take 40 mg by mouth every morning.       . promethazine (PHENERGAN) 25 MG tablet Take 25 mg by mouth every 6 (six) hours as needed for nausea or vomiting.      Marland Kitchen QUEtiapine (SEROQUEL) 400 MG tablet Take 400 mg by mouth at bedtime.       Marland Kitchen testosterone (ANDROGEL) 50 MG/5GM GEL Place 5 g onto  the skin daily. Applied to either shoulder       No current facility-administered medications for this visit.    Allergies as of 05/03/2014 - Review Complete 05/03/2014  Allergen Reaction Noted  . Dairy aid [lactase] Nausea And Vomiting 12/06/2010    Past Medical History  Diagnosis Date  . DDD (degenerative disc disease)   . GERD (gastroesophageal reflux disease)   . COPD (chronic obstructive pulmonary disease)   . Hypercholesterolemia   . Anxiety   . Achalasia 2008 168 LBS  . Chronic abdominal pain     AUG 2010 CT ABD W/ IVC-NL PANCREAS, GB, LIVER  . Hypercholesteremia   . Dysphagia   . Diabetes mellitus without complication     Past Surgical History  Procedure Laterality Date  . Heller myotomy  DEC 2008 DR. CARL WESTCOTT   . Esophagogastroduodenoscopy       MAR 2012 ([facility]) TTS DIL 20 MM, JAN 2010 Gastritis  . Hemorrhoid surgery    . Colonoscopy  2009 [facility]    Normal  . Savory dilation  05/03/2011    Stricture/mild gastritis  . Esophagogastroduodenoscopy N/A 03/01/2013    QIH:[id number] body of esophagus secondary to achalasia/Ulcerative reflux esophagitis felt to be source of patient's upper GI bleed/Erosive gastroduodenitis. NORMAL H.plyori serologies  . Colonoscopy N/A 03/19/2013    [facility]:11 COLORECTAL POLYPS REMOVED/RECTAL BLEEDING DUE TO INTERNAL HEMORRHOIDS. TAs. next TCS 02/2018    Family History  Problem Relation Age of Onset  . Colon cancer Neg Hx   . Colon polyps Neg Hx   . Anesthesia problems Neg Hx   . Hypotension Neg Hx   . Malignant hyperthermia Neg Hx   . Pseudochol deficiency Neg Hx   . Cancer      family history  . Heart attack      family history    History   Social History  . Marital Status: Married    Spouse Name: N/A    Number of Children: N/A  . Years of Education: N/A   Occupational History  . Not on file.   Social History Main Topics  . Smoking status: Former Smoker -- 1.00 packs/day    Types: Cigarettes    Quit date: 08/27/1995  . Smokeless tobacco: Current User    Types: Chew  . Alcohol Use: No  . Drug Use: No  . Sexual Activity: Not Currently   Other Topics Concern  . Not on file   Social History Narrative  . No narrative on file      ROS:  General: Negative for anorexia, weight loss, fever, chills. Positive for fatigue. Eyes: Negative for vision changes.  ENT: Negative for hoarseness, nasal congestion. See history of present illness CV: Negative for chest pain, angina, palpitations, dyspnea on exertion, peripheral edema.  Respiratory: Negative for dyspnea at rest, dyspnea on exertion, cough, sputum, wheezing.  GI: See history of present illness. GU:  Negative for dysuria, hematuria, urinary incontinence, urinary frequency, nocturnal urination.  MS:  Negative for joint pain, low back pain.  Derm: Negative for rash or itching.  Neuro: Negative for weakness, abnormal sensation, seizure, frequent headaches, memory loss, confusion.  Psych: Negative for anxiety, depression, suicidal ideation, hallucinations.  Endo: Negative for unusual weight change.  Heme: Negative for bruising or bleeding. Allergy: Negative for rash or hives.    Physical Examination:  BP 116/74  Pulse 80  Temp(Src) 97.8 F (36.6 C) (Oral)  Ht 6\' 1"  (1.854 m)  Wt 204 lb 12.8 oz (92.897 kg)  BMI  27.03 kg/m2   General: Well-nourished, well-developed in no acute distress.  Head: Normocephalic, atraumatic.   Eyes: Conjunctiva pink, no icterus. Mouth: Oropharyngeal mucosa moist and pink , no lesions erythema or exudate. Neck: Supple without thyromegaly, masses, or lymphadenopathy.  Lungs: Clear to auscultation bilaterally.  Heart: Regular rate and rhythm, no murmurs rubs or gallops.  Abdomen: Bowel sounds are normal, mild right upper quadrant tenderness, nondistended, no hepatosplenomegaly or masses, no abdominal bruits or    hernia , no rebound or guarding.   Rectal: Not performed Extremities: No lower extremity edema. No clubbing or deformities.  Neuro: Alert and oriented x 4 , grossly normal neurologically.  Skin: Warm and dry, no rash or jaundice.   Psych: Alert and cooperative, normal mood and affect.  Labs: Lab Results  Component Value Date   WBC 8.2 04/30/2014   HGB 14.7 04/30/2014   HCT 42.9 04/30/2014   MCV 89.2 04/30/2014   PLT 245 04/30/2014   Lab Results  Component Value Date   CREATININE 1.42* 04/30/2014   BUN 17 04/30/2014   NA 140 04/30/2014   K 3.9 04/30/2014   CL 100 04/30/2014   CO2 29 04/30/2014   Lab Results  Component Value Date   ALT 19 04/30/2014   AST 22 04/30/2014   ALKPHOS 79 04/30/2014   BILITOT 0.4 04/30/2014   Lab Results  Component Value Date   INR 1.02 04/30/2014   INR 1.0 08/05/2007   Lab Results  Component Value Date   LIPASE 39 04/30/2014      Imaging Studies: Dg Chest 2 View  04/30/2014   CLINICAL DATA:  Chest pain for several days, shortness of breath, vomiting blood and passing blood in stool. History of esophageal achalasia.  EXAM: CHEST  2 VIEW  COMPARISON:  03/23/2013; 02/27/2013; CT abdomen pelvis - 03/02/2013  FINDINGS: Grossly unchanged borderline enlarged cardiac silhouette and mediastinal contours. The lungs remain hyperexpanded. No focal airspace opacities. No pleural effusion or pneumothorax. No evidence of edema. No acute osseus abnormalities. Regional soft tissues appear normal.  IMPRESSION: Hyperexpanded lungs without acute cardiopulmonary disease.   Electronically Signed   By: Sandi Mariscal M.D.   On: 04/30/2014 16:12   Ct Angio Chest Pe W/cm &/or Wo Cm  04/30/2014   CLINICAL DATA:  Rule out pulmonary embolism versus aortic dissection. Chest pain and hemoptysis.  EXAM: CT ANGIOGRAPHY CHEST WITH CONTRAST  TECHNIQUE: Multidetector CT imaging of the chest was performed using the standard protocol during bolus administration of intravenous contrast. Multiplanar CT image reconstructions and MIPs were obtained to evaluate the vascular anatomy.  CONTRAST:  189mL OMNIPAQUE IOHEXOL 350 MG/ML SOLN  COMPARISON:  CT abdomen 03/02/2013 and 04/21/2009.  FINDINGS: Lungs are adequately inflated with mild emphysematous disease. There is a small right pleural effusion with mild associated atelectasis. There is a 5 mm nodule over the medial right lower lobe unchanged from 2010. The heart is normal in size. There is minimal calcified plaque over the left anterior descending coronary artery. There is no evidence of thoracic aortic aneurysm or dissection. There is no evidence of pulmonary embolism. There is no mediastinal, hilar or axillary adenopathy. There is persistent fluid-filled dilatation of the esophagus unchanged from 2010.  Images through the upper abdomen are unremarkable. There is mild spondylosis of the spine.  Review of the MIP images  confirms the above findings.  IMPRESSION: No evidence of pulmonary embolism or aortic dissection.  Small amount right pleural fluid with associated basilar atelectasis. Minimal emphysematous disease.  Stable fluid-filled dilatation of the esophagus which may be due to achalasia.  Minimal atherosclerotic coronary artery disease.   Electronically Signed   By: Marin Olp M.D.   On: 04/30/2014 21:04

## 2014-05-03 NOTE — Assessment & Plan Note (Signed)
58 year old gentleman with history of achalasia status post Heller's myotomy remotely who presents for postprandial vomiting. Symptoms associated with right upper quadrant/epigastric pain. Evaluate in the ER recently for these symptoms. Describes recent hematemesis after multiple episodes of vomiting. History of chronic, worsening dysphagia. Previous workup as outlined above. I suspect he is having regurgitation secondary to esophageal dysmotility rather than vomiting. However in light of patient manual epigastric/right upper quadrant pain, consider biliary etiology as well. Patient does not appear toxic on exam. Appears fairly comfortable. Discussed at length needs to consume very small amounts of food at a time and supposedly was to maintain hydration given his history of achalasia. Will get barium esophagram and abdominal ultrasound within the next couple of days. If any evidence of stricture would offer an upper endoscopy. Continue PPI therapy. Further recommendations to follow.

## 2014-05-04 NOTE — Progress Notes (Signed)
cc'ed to pcp °

## 2014-05-13 ENCOUNTER — Ambulatory Visit (HOSPITAL_COMMUNITY)
Admission: RE | Admit: 2014-05-13 | Discharge: 2014-05-13 | Disposition: A | Discharge status: Home / Self Care | Payer: Medicare Other | Source: Ambulatory Visit | Attending: Gastroenterology | Admitting: Gastroenterology

## 2014-05-13 ENCOUNTER — Other Ambulatory Visit: Payer: Self-pay | Admitting: Gastroenterology

## 2014-05-13 DIAGNOSIS — K220 Achalasia of cardia: Secondary | ICD-10-CM

## 2014-05-13 DIAGNOSIS — R1011 Right upper quadrant pain: Secondary | ICD-10-CM

## 2014-05-13 DIAGNOSIS — R1110 Vomiting, unspecified: Secondary | ICD-10-CM

## 2014-05-13 DIAGNOSIS — R1319 Other dysphagia: Secondary | ICD-10-CM

## 2014-05-13 DIAGNOSIS — R1013 Epigastric pain: Secondary | ICD-10-CM | POA: Diagnosis not present

## 2014-05-13 DIAGNOSIS — K7689 Other specified diseases of liver: Secondary | ICD-10-CM | POA: Insufficient documentation

## 2014-05-13 DIAGNOSIS — E119 Type 2 diabetes mellitus without complications: Secondary | ICD-10-CM

## 2014-05-14 ENCOUNTER — Encounter (HOSPITAL_COMMUNITY): Payer: Self-pay | Admitting: Emergency Medicine

## 2014-05-14 ENCOUNTER — Inpatient Hospital Stay (HOSPITAL_COMMUNITY)
Admission: EM | Admit: 2014-05-14 | Discharge: 2014-05-16 | DRG: 392 | Disposition: A | Discharge status: Home / Self Care | Payer: Medicare Other | Attending: Family Medicine | Admitting: Family Medicine

## 2014-05-14 DIAGNOSIS — G8929 Other chronic pain: Secondary | ICD-10-CM | POA: Diagnosis present

## 2014-05-14 DIAGNOSIS — E7800 Pure hypercholesterolemia, unspecified: Secondary | ICD-10-CM | POA: Diagnosis present

## 2014-05-14 DIAGNOSIS — J4489 Other specified chronic obstructive pulmonary disease: Secondary | ICD-10-CM | POA: Diagnosis present

## 2014-05-14 DIAGNOSIS — J449 Chronic obstructive pulmonary disease, unspecified: Secondary | ICD-10-CM | POA: Diagnosis present

## 2014-05-14 DIAGNOSIS — R109 Unspecified abdominal pain: Secondary | ICD-10-CM | POA: Diagnosis present

## 2014-05-14 DIAGNOSIS — E119 Type 2 diabetes mellitus without complications: Secondary | ICD-10-CM

## 2014-05-14 DIAGNOSIS — B370 Candidal stomatitis: Secondary | ICD-10-CM | POA: Diagnosis present

## 2014-05-14 DIAGNOSIS — Z79899 Other long term (current) drug therapy: Secondary | ICD-10-CM

## 2014-05-14 DIAGNOSIS — F17200 Nicotine dependence, unspecified, uncomplicated: Secondary | ICD-10-CM | POA: Diagnosis present

## 2014-05-14 DIAGNOSIS — R112 Nausea with vomiting, unspecified: Secondary | ICD-10-CM

## 2014-05-14 DIAGNOSIS — Z8249 Family history of ischemic heart disease and other diseases of the circulatory system: Secondary | ICD-10-CM

## 2014-05-14 DIAGNOSIS — E876 Hypokalemia: Secondary | ICD-10-CM | POA: Diagnosis present

## 2014-05-14 DIAGNOSIS — F411 Generalized anxiety disorder: Secondary | ICD-10-CM | POA: Diagnosis present

## 2014-05-14 DIAGNOSIS — Z23 Encounter for immunization: Secondary | ICD-10-CM

## 2014-05-14 DIAGNOSIS — R1310 Dysphagia, unspecified: Secondary | ICD-10-CM | POA: Diagnosis present

## 2014-05-14 DIAGNOSIS — E860 Dehydration: Secondary | ICD-10-CM | POA: Diagnosis present

## 2014-05-14 DIAGNOSIS — K220 Achalasia of cardia: Principal | ICD-10-CM | POA: Diagnosis present

## 2014-05-14 DIAGNOSIS — R1013 Epigastric pain: Secondary | ICD-10-CM

## 2014-05-14 DIAGNOSIS — K219 Gastro-esophageal reflux disease without esophagitis: Secondary | ICD-10-CM | POA: Diagnosis present

## 2014-05-14 LAB — GLUCOSE, CAPILLARY: Glucose-Capillary: 150 mg/dL — ABNORMAL HIGH (ref 70–99)

## 2014-05-14 LAB — URINALYSIS, ROUTINE W REFLEX MICROSCOPIC
Glucose, UA: NEGATIVE mg/dL
HGB URINE DIPSTICK: NEGATIVE
Ketones, ur: NEGATIVE mg/dL
Nitrite: NEGATIVE
Protein, ur: NEGATIVE mg/dL
SPECIFIC GRAVITY, URINE: 1.015 (ref 1.005–1.030)
Urobilinogen, UA: 0.2 mg/dL (ref 0.0–1.0)
pH: 5.5 (ref 5.0–8.0)

## 2014-05-14 LAB — CBC
HEMATOCRIT: 43.1 % (ref 39.0–52.0)
Hemoglobin: 15 g/dL (ref 13.0–17.0)
MCH: 30.7 pg (ref 26.0–34.0)
MCHC: 34.8 g/dL (ref 30.0–36.0)
MCV: 88.1 fL (ref 78.0–100.0)
Platelets: 250 10*3/uL (ref 150–400)
RBC: 4.89 MIL/uL (ref 4.22–5.81)
RDW: 12.8 % (ref 11.5–15.5)
WBC: 9.2 10*3/uL (ref 4.0–10.5)

## 2014-05-14 LAB — COMPREHENSIVE METABOLIC PANEL
ALT: 18 U/L (ref 0–53)
AST: 20 U/L (ref 0–37)
Albumin: 3.4 g/dL — ABNORMAL LOW (ref 3.5–5.2)
Alkaline Phosphatase: 73 U/L (ref 39–117)
Anion gap: 13 (ref 5–15)
BUN: 26 mg/dL — ABNORMAL HIGH (ref 6–23)
CO2: 29 mEq/L (ref 19–32)
Calcium: 9.4 mg/dL (ref 8.4–10.5)
Chloride: 94 mEq/L — ABNORMAL LOW (ref 96–112)
Creatinine, Ser: 1.45 mg/dL — ABNORMAL HIGH (ref 0.50–1.35)
GFR calc Af Amer: 59 mL/min — ABNORMAL LOW (ref >90)
GFR calc non Af Amer: 51 mL/min — ABNORMAL LOW (ref >90)
Glucose, Bld: 95 mg/dL (ref 70–99)
Potassium: 3.6 mEq/L — ABNORMAL LOW (ref 3.7–5.3)
Sodium: 136 mEq/L — ABNORMAL LOW (ref 137–147)
Total Bilirubin: 0.5 mg/dL (ref 0.3–1.2)
Total Protein: 7.4 g/dL (ref 6.0–8.3)

## 2014-05-14 LAB — LIPASE, BLOOD: Lipase: 50 U/L (ref 11–59)

## 2014-05-14 LAB — URINE MICROSCOPIC-ADD ON

## 2014-05-14 MED ORDER — LORAZEPAM 0.5 MG PO TABS: 0.5000 mg | ORAL_TABLET | Freq: Every day | ORAL | Status: DC

## 2014-05-14 MED ORDER — HYDROMORPHONE HCL 1 MG/ML IJ SOLN
1.0000 mg | INTRAMUSCULAR | Status: DC | PRN
  Administered 2014-05-14 – 2014-05-16 (×5): 1 mg via INTRAVENOUS
  Filled 2014-05-14 (×5): qty 1

## 2014-05-14 MED ORDER — SODIUM CHLORIDE 0.9 % IV SOLN
INTRAVENOUS | Status: DC
  Administered 2014-05-14 – 2014-05-15 (×3): via INTRAVENOUS

## 2014-05-14 MED ORDER — MORPHINE SULFATE 4 MG/ML IJ SOLN
4.0000 mg | Freq: Once | INTRAMUSCULAR | Status: AC
  Administered 2014-05-14: 4 mg via INTRAVENOUS
  Filled 2014-05-14: qty 1

## 2014-05-14 MED ORDER — METOPROLOL SUCCINATE ER 25 MG PO TB24
25.0000 mg | ORAL_TABLET | Freq: Every day | ORAL | Status: DC
  Administered 2014-05-15: 25 mg via ORAL
  Filled 2014-05-14 (×2): qty 1

## 2014-05-14 MED ORDER — CHLORHEXIDINE GLUCONATE 0.12 % MT SOLN
15.0000 mL | Freq: Two times a day (BID) | OROMUCOSAL | Status: DC
  Administered 2014-05-14 – 2014-05-16 (×5): 15 mL via OROMUCOSAL
  Filled 2014-05-14 (×5): qty 15

## 2014-05-14 MED ORDER — FLUCONAZOLE IN SODIUM CHLORIDE 200-0.9 MG/100ML-% IV SOLN
INTRAVENOUS | Status: AC
  Filled 2014-05-14: qty 100

## 2014-05-14 MED ORDER — DULOXETINE HCL 60 MG PO CPEP
60.0000 mg | ORAL_CAPSULE | Freq: Every day | ORAL | Status: DC
  Administered 2014-05-15 – 2014-05-16 (×2): 60 mg via ORAL
  Filled 2014-05-14 (×2): qty 1

## 2014-05-14 MED ORDER — INFLUENZA VAC SPLIT QUAD 0.5 ML IM SUSY
0.5000 mL | PREFILLED_SYRINGE | INTRAMUSCULAR | Status: AC
  Administered 2014-05-15: 0.5 mL via INTRAMUSCULAR
  Filled 2014-05-14: qty 0.5

## 2014-05-14 MED ORDER — CETYLPYRIDINIUM CHLORIDE 0.05 % MT LIQD
7.0000 mL | Freq: Two times a day (BID) | OROMUCOSAL | Status: DC
  Administered 2014-05-15 – 2014-05-16 (×4): 7 mL via OROMUCOSAL

## 2014-05-14 MED ORDER — PNEUMOCOCCAL VAC POLYVALENT 25 MCG/0.5ML IJ INJ
0.5000 mL | INJECTION | INTRAMUSCULAR | Status: AC
  Administered 2014-05-15: 0.5 mL via INTRAMUSCULAR
  Filled 2014-05-14: qty 0.5

## 2014-05-14 MED ORDER — LORAZEPAM 2 MG/ML IJ SOLN
2.0000 mg | Freq: Two times a day (BID) | INTRAMUSCULAR | Status: DC
  Filled 2014-05-14: qty 1

## 2014-05-14 MED ORDER — MORPHINE SULFATE 2 MG/ML IJ SOLN
2.0000 mg | INTRAMUSCULAR | Status: DC | PRN
  Administered 2014-05-14: 2 mg via INTRAVENOUS
  Filled 2014-05-14: qty 1

## 2014-05-14 MED ORDER — ALBUTEROL SULFATE (2.5 MG/3ML) 0.083% IN NEBU: 2.5000 mg | INHALATION_SOLUTION | Freq: Four times a day (QID) | RESPIRATORY_TRACT | Status: DC | PRN

## 2014-05-14 MED ORDER — PANTOPRAZOLE SODIUM 40 MG IV SOLR
40.0000 mg | Freq: Two times a day (BID) | INTRAVENOUS | Status: DC
  Administered 2014-05-14 – 2014-05-15 (×2): 40 mg via INTRAVENOUS
  Filled 2014-05-14 (×2): qty 40

## 2014-05-14 MED ORDER — FLUCONAZOLE 100MG IVPB
100.0000 mg | INTRAVENOUS | Status: DC
  Administered 2014-05-15: 100 mg via INTRAVENOUS
  Filled 2014-05-14 (×2): qty 50

## 2014-05-14 MED ORDER — SODIUM CHLORIDE 0.9 % IV SOLN
Freq: Once | INTRAVENOUS | Status: AC
  Administered 2014-05-14: 14:00:00 via INTRAVENOUS

## 2014-05-14 MED ORDER — LORAZEPAM 1 MG PO TABS
2.0000 mg | ORAL_TABLET | Freq: Two times a day (BID) | ORAL | Status: DC
  Administered 2014-05-14 – 2014-05-16 (×5): 2 mg via ORAL
  Filled 2014-05-14 (×5): qty 2

## 2014-05-14 MED ORDER — ONDANSETRON HCL 4 MG/2ML IJ SOLN: 4.0000 mg | Freq: Four times a day (QID) | INTRAMUSCULAR | Status: DC | PRN

## 2014-05-14 MED ORDER — MORPHINE SULFATE 2 MG/ML IJ SOLN
2.0000 mg | Freq: Once | INTRAMUSCULAR | Status: AC
  Administered 2014-05-14: 2 mg via INTRAVENOUS
  Filled 2014-05-14: qty 1

## 2014-05-14 MED ORDER — FLUCONAZOLE 100MG IVPB
100.0000 mg | INTRAVENOUS | Status: DC
  Filled 2014-05-14 (×2): qty 50

## 2014-05-14 MED ORDER — ONDANSETRON HCL 4 MG PO TABS: 4.0000 mg | ORAL_TABLET | Freq: Four times a day (QID) | ORAL | Status: DC | PRN

## 2014-05-14 MED ORDER — INSULIN ASPART 100 UNIT/ML ~~LOC~~ SOLN
0.0000 [IU] | Freq: Three times a day (TID) | SUBCUTANEOUS | Status: DC
  Administered 2014-05-15: 1 [IU] via SUBCUTANEOUS

## 2014-05-14 MED ORDER — FLUCONAZOLE IN SODIUM CHLORIDE 200-0.9 MG/100ML-% IV SOLN
200.0000 mg | Freq: Once | INTRAVENOUS | Status: AC
  Administered 2014-05-14: 200 mg via INTRAVENOUS
  Filled 2014-05-14: qty 100

## 2014-05-14 MED ORDER — POTASSIUM CHLORIDE 10 MEQ/100ML IV SOLN
10.0000 meq | INTRAVENOUS | Status: AC
  Administered 2014-05-14 (×4): 10 meq via INTRAVENOUS
  Filled 2014-05-14 (×4): qty 100

## 2014-05-14 MED ORDER — ACETAMINOPHEN 325 MG PO TABS
650.0000 mg | ORAL_TABLET | Freq: Four times a day (QID) | ORAL | Status: DC | PRN
  Administered 2014-05-16 (×2): 650 mg via ORAL
  Filled 2014-05-14 (×2): qty 2

## 2014-05-14 MED ORDER — AMITRIPTYLINE HCL 25 MG PO TABS
75.0000 mg | ORAL_TABLET | Freq: Every day | ORAL | Status: DC
  Administered 2014-05-14 – 2014-05-16 (×3): 75 mg via ORAL
  Filled 2014-05-14 (×3): qty 3

## 2014-05-14 MED ORDER — ACETAMINOPHEN 650 MG RE SUPP: 650.0000 mg | Freq: Four times a day (QID) | RECTAL | Status: DC | PRN

## 2014-05-14 NOTE — H&P (Signed)
History and Physical  Walter Herring IWP:[id number] DOB: Oct 28, 1955 DOA: 05/14/2014  Referring physician: Dr. Noemi Chapel in ED PCP: Maricela Curet, MD   Chief Complaint: chest and abdominal pain  HPI:  58 year old man with long-standing history of achalasia, status post Heller myotomy 2008 presented with worsening chest and abdominal pain, vomiting and inability TE or drink fluids. Screening evaluation revealed dehydration and this was referred for further evaluation.  Patient has had chest and abdominal pain for months now secondary to achalasia. He has been evaluated most recently 9/8 by gastroenterology and the last 2 weeks has had a CT angiogram of the chest which was negative for non-esophageal pathology and barium esophagram which demonstrates severe narrowing of the distal esophagus near the gastroesophageal junction consistent with achalasia. He reports long-standing painful and she has been significantly worsening over the last several days to the point where he can tolerate minimal fluids and he has not been able the for several days. The pain is described as in the center of his lower chest and upper abdomen, constant in nature with no specific aggravating or alleviating factors. He vomits any time he tries to eat or drink something. He has had some diarrhea.  In the emergency department afebrile, vital signs stable. No hypoxia. Chemistry panel suggested dehydration with elevated BUN, creatinine and low sodium. Potassium 3.6. Hepatic function panel, lipase unremarkable. CBC normal.  Review of Systems:  Negative for, visual changes, rash, new muscle aches, new SOB, dysuria  Positive for subjective fever, sore throat, mouth pain. He might have coughed up a small amount of blood.  Past Medical History  Diagnosis Date  . DDD (degenerative disc disease)   . GERD (gastroesophageal reflux disease)   . COPD (chronic obstructive pulmonary disease)   . Hypercholesterolemia   .  Anxiety   . Achalasia 2008 168 LBS  . Chronic abdominal pain     AUG 2010 CT ABD W/ IVC-NL PANCREAS, GB, LIVER  . Hypercholesteremia   . Dysphagia   . Diabetes mellitus without complication     Past Surgical History  Procedure Laterality Date  . Heller myotomy  DEC 2008 DR. CARL WESTCOTT  . Esophagogastroduodenoscopy       MAR 2012 ([facility]) TTS DIL 20 MM, JAN 2010 Gastritis  . Hemorrhoid surgery    . Colonoscopy  2009 [facility]    Normal  . Savory dilation  05/03/2011    Stricture/mild gastritis  . Esophagogastroduodenoscopy N/A 03/01/2013    [id number] body of esophagus secondary to achalasia/Ulcerative reflux esophagitis felt to be source of patient's upper GI bleed/Erosive gastroduodenitis. NORMAL H.plyori serologies  . Colonoscopy N/A 03/19/2013    SLF:11 COLORECTAL POLYPS REMOVED/RECTAL BLEEDING DUE TO INTERNAL HEMORRHOIDS. TAs. next TCS 02/2018    Social History:  reports that he quit smoking about 18 years ago. His smoking use included Cigarettes. He smoked 1.00 pack per day. His smokeless tobacco use includes Chew. He reports that he does not drink alcohol or use illicit drugs.  Allergies  Allergen Reactions  . Dairy Aid [Lactase] Nausea And Vomiting    Family History  Problem Relation Age of Onset  . Colon cancer Neg Hx   . Colon polyps Neg Hx   . Anesthesia problems Neg Hx   . Hypotension Neg Hx   . Malignant hyperthermia Neg Hx   . Pseudochol deficiency Neg Hx   . Cancer      family history  . Heart attack      family history  . Seizures  Mother   . Seizures Father   . Seizures Sister      Prior to Admission medications   Medication Sig Start Date End Date Taking? Authorizing Provider  albuterol (PROVENTIL) (2.5 MG/3ML) 0.083% nebulizer solution Take 2.5 mg by nebulization every 6 (six) hours as needed for shortness of breath.    Yes Historical Provider, MD  amitriptyline (ELAVIL) 75 MG tablet Take 75 mg by mouth at bedtime.   Yes Historical Provider, MD  DULoxetine  (CYMBALTA) 60 MG capsule Take 60 mg by mouth daily.     Yes Historical Provider, MD  ferrous fumarate (HEMOCYTE - 106 MG FE) 325 (106 FE) MG TABS tablet Take 1 tablet by mouth daily.   Yes Historical Provider, MD  fluticasone (FLONASE) 50 MCG/ACT nasal spray Place 1 spray into the nose daily as needed for rhinitis or allergies.  11/24/12  Yes Historical Provider, MD  lidocaine (XYLOCAINE) 2 % solution Use as directed 30 mLs in the mouth or throat daily as needed. 05/01/14  Yes Historical Provider, MD  Linaclotide (LINZESS) 290 MCG CAPS capsule Take 290 mcg by mouth daily as needed (for stomach).   Yes Historical Provider, MD  LORazepam (ATIVAN) 2 MG tablet Take 2 mg by mouth at bedtime.    Yes Historical Provider, MD  meclizine (ANTIVERT) 25 MG tablet Take 25 mg by mouth 2 (two) times daily.  04/18/14  Yes Historical Provider, MD  metFORMIN (GLUCOPHAGE) 500 MG tablet Take 500 mg by mouth 2 (two) times daily. 04/30/14  Yes Historical Provider, MD  metoprolol succinate (TOPROL-XL) 25 MG 24 hr tablet Take 1 tablet (25 mg total) by mouth daily. 09/01/13  Yes Herminio Commons, MD  Multiple Vitamin (MULTIVITAMIN WITH MINERALS) TABS tablet Take 1 tablet by mouth daily.   Yes Historical Provider, MD  oxyCODONE-acetaminophen (PERCOCET) 10-325 MG per tablet Take 1 tablet by mouth every 6 (six) hours as needed for pain.  04/01/14  Yes Historical Provider, MD  pantoprazole (PROTONIX) 40 MG tablet Take 1 tablet (40 mg total) by mouth 2 (two) times daily. 03/31/13  Yes Danie Binder, MD  pravastatin (PRAVACHOL) 40 MG tablet Take 40 mg by mouth every morning.    Yes Historical Provider, MD  promethazine (PHENERGAN) 25 MG tablet Take 25 mg by mouth every 6 (six) hours as needed for nausea or vomiting.   Yes Historical Provider, MD  QUEtiapine (SEROQUEL) 400 MG tablet Take 400 mg by mouth at bedtime.  11/24/12  Yes Historical Provider, MD  testosterone (ANDROGEL) 50 MG/5GM GEL Place 5 g onto the skin daily. Applied to either  shoulder   Yes Historical Provider, MD   Physical Exam: Filed Vitals:   05/14/14 1345 05/14/14 1400 05/14/14 1415 05/14/14 1430  BP:  109/92  109/86  Pulse: 96 101 106 102  Temp:      TempSrc:      Resp: 14 25 20 11   Weight:      SpO2: 95% 93% 95% 96%   General: Examined in the emergency department. Appears calm and mildly uncomfortable. Nontoxic. Eyes: PERRL, normal lids, irises  ENT: grossly normal hearing, lips & tongue. Small punctate white lesions on buccal mucosa as well as white exudate on tongue and oropharynx. Neck: no LAD, masses or thyromegaly Cardiovascular: RRR, no m/r/g. No LE edema. Respiratory: CTA bilaterally, no w/r/r. Normal respiratory effort. Abdomen: soft, nondistended. Moderate generalized tenderness Skin: no rash or induration seen  Musculoskeletal: grossly normal tone BUE/BLE Psychiatric: grossly normal mood and affect, speech  fluent and appropriate Neurologic: grossly non-focal.  Wt Readings from Last 3 Encounters:  05/14/14 90.719 kg (200 lb)  05/03/14 92.897 kg (204 lb 12.8 oz)  04/30/14 97.07 kg (214 lb)    Labs on Admission:  Basic Metabolic Panel:  Recent Labs Lab 05/14/14 1336  NA 136*  K 3.6*  CL 94*  CO2 29  GLUCOSE 95  BUN 26*  CREATININE 1.45*  CALCIUM 9.4    Liver Function Tests:  Recent Labs Lab 05/14/14 1336  AST 20  ALT 18  ALKPHOS 73  BILITOT 0.5  PROT 7.4  ALBUMIN 3.4*    Recent Labs Lab 05/14/14 1336  LIPASE 50   CBC:  Recent Labs Lab 05/14/14 1336  WBC 9.2  HGB 15.0  HCT 43.1  MCV 88.1  PLT 250      Recent Labs  04/30/14 1550  PROBNP 29.9    Radiological Exams on Admission: Dg Esophagus  05/13/2014   CLINICAL DATA:  Dysphagia. History of achalasia status post myotomy. History of esophageal stricture.  EXAM: ESOPHOGRAM/BARIUM SWALLOW  TECHNIQUE: Single contrast examination was performed using  thin barium.  FLUOROSCOPY TIME:  1 min 42 seconds  COMPARISON:  Chest CT 04/30/2014.  Barium  swallow 07/24/2011.  FINDINGS: Patient vomited immediately prior to the examination on ultrasound. Only a single contrast examination was performed with the patient in the upright position.  Evaluation of esophageal motility was limited due to upright positioning, however very little to no primary peristalsis was seen. Marked tertiary contractions demonstrated on the prior swallow study were not observed on the current examination, with at most only mild tertiary contractions intermittently observed distally.  The thoracic esophagus was patulous. Contrast pooled in the distal esophagus, which demonstrated marked tapered narrowing distally. None of the initial contrast bolus passed through the GE junction into the stomach. After an additional swallow of contrast to increase the barium column in the esophagus, a small stream of contrast passed through into the stomach, however the majority of the contrast remained in the distal esophagus with only a small amount of additional contrast passing intermittently into the stomach during additional observation. Esophageal narrowing at the GE junction appears more severe than on the prior swallow study.  The patient attempted to swallow a barium tablet but was unable to and vomited. The examination was terminated at this point.  IMPRESSION: Severe narrowing of the distal esophagus near the GE junction, greater than on the 2012 study, with only a small amount of contrast intermittently passing into the stomach. Patulous, hypotonic thoracic esophagus consistent with achalasia.   Electronically Signed   By: Logan Bores   On: 05/13/2014 09:56   US Abdomen Limited Ruq  05/13/2014   CLINICAL DATA:  Right upper quadrant abdominal pain with vomiting. History of diabetes.  EXAM: US ABDOMEN LIMITED - RIGHT UPPER QUADRANT  COMPARISON:  Chest CTA 04/30/2014.  FINDINGS: Gallbladder:  Suboptimally visualized due to bowel gas. No gallbladder wall thickening, pericholecystic fluid or  gallstones are identified. Negative sonographic Murphy sign.  Common bile duct:  Diameter: Suboptimally visualized, but grossly normal in caliber, measuring approximately 3.6 mm.  Liver:  Hepatic echogenicity is diffusely increased, corresponding with steatosis on prior CT. The liver measures at least 16.1 cm in height.  IMPRESSION: 1. No evidence of gallbladder or biliary disease. 2. Hepatic steatosis.   Electronically Signed   By: Camie Patience M.D.   On: 05/13/2014 09:20    Principal Problem:   Abdominal pain Active Problems:  Achalasia   Dehydration   Hypokalemia   DM type 2 (diabetes mellitus, type 2)   Assessment/Plan 1. Acute on chronic abdominal pain, vomiting secondary to achalasia. 2. Achalasia, GERD. PPI. 3. Dehydration. 4. Hypokalemia. 5. Oral candidiasis. 6. DM type 2. Stable. Hold metformin.   Appears stable for admission to the medical floor. Plan IV hydration, antiemetics, pain control. GI consultation in the morning for further recommendations.  Repeat potassium.  Diflucan.  Code Status: full code  DVT prophylaxis: SCDs Family Communication: none present Disposition Plan/Anticipated LOS: admit, 2 days  Time spent: 6 minutes  Murray Hodgkins, MD  [facility] Pager [phone] 05/14/2014, 3:20 PM

## 2014-05-14 NOTE — ED Provider Notes (Signed)
CSN: [id number]     Arrival date & time 05/14/14  1245 History   First MD Initiated Contact with Patient 05/14/14 1316     This chart was scribed for Johnna Acosta, MD by Forrestine Him, ED Scribe. This patient was seen in room APA12/APA12 and the patient's care was started 2:41 PM.  Chief Complaint  Patient presents with  . Abdominal Pain   HPI  HPI Comments: Walter Herring is a 58 y.o. male with a PMHx of achalasia, DDD, GERD, COPD, DM who presents to the Emergency Department complaining of constant, moderate abdominal pain onset 1 month that has progressively worsened. Pt describes pain as "something ripping me open". Pain is worsened with drinking and swallowing. No alleviating factors at this time. He also reports diarrhea in last 2 days that has now resolved. Pt was seen 2 weeks ago for same and followed with his PCP Dr. Cindie Laroche. Ultrasound performed 9/18 without any obvious source of pt's complaints. CAT scan performed on 9/5 confirming achalasia with fluid filled esophagus; no PE, no dissection. 03/01/13 EGD performed showing achalasia and errosive esophagus during admission to hospital for abdominal pain. PSHx includes heller's myotomy performed in 20008. Pt is due to follow up with Dr. Oneida Alar within the coming weeks; appointment not yet scheduled.   Past Medical History  Diagnosis Date  . DDD (degenerative disc disease)   . GERD (gastroesophageal reflux disease)   . COPD (chronic obstructive pulmonary disease)   . Hypercholesterolemia   . Anxiety   . Achalasia 2008 168 LBS  . Chronic abdominal pain     AUG 2010 CT ABD W/ IVC-NL PANCREAS, GB, LIVER  . Hypercholesteremia   . Dysphagia   . Diabetes mellitus without complication    Past Surgical History  Procedure Laterality Date  . Heller myotomy  DEC 2008 DR. CARL WESTCOTT  . Esophagogastroduodenoscopy       MAR 2012 ([facility]) TTS DIL 20 MM, JAN 2010 Gastritis  . Hemorrhoid surgery    . Colonoscopy  2009 [facility]    Normal  . Savory  dilation  05/03/2011    Stricture/mild gastritis  . Esophagogastroduodenoscopy N/A 03/01/2013    [id number] body of esophagus secondary to achalasia/Ulcerative reflux esophagitis felt to be source of patient's upper GI bleed/Erosive gastroduodenitis. NORMAL H.plyori serologies  . Colonoscopy N/A 03/19/2013    SLF:11 COLORECTAL POLYPS REMOVED/RECTAL BLEEDING DUE TO INTERNAL HEMORRHOIDS. TAs. next TCS 02/2018   Family History  Problem Relation Age of Onset  . Colon cancer Neg Hx   . Colon polyps Neg Hx   . Anesthesia problems Neg Hx   . Hypotension Neg Hx   . Malignant hyperthermia Neg Hx   . Pseudochol deficiency Neg Hx   . Cancer      family history  . Heart attack      family history   History  Substance Use Topics  . Smoking status: Former Smoker -- 1.00 packs/day    Types: Cigarettes    Quit date: 08/27/1995  . Smokeless tobacco: Current User    Types: Chew  . Alcohol Use: No    Review of Systems  Constitutional: Negative for fever and chills.  Respiratory: Negative for shortness of breath.   Cardiovascular: Negative for chest pain.  Gastrointestinal: Positive for abdominal pain and diarrhea. Negative for vomiting.  Genitourinary: Negative for dysuria.  All other systems reviewed and are negative.     Allergies  Dairy aid  Home Medications   Prior to Admission medications  Medication Sig Start Date End Date Taking? Authorizing Provider  albuterol (PROVENTIL) (2.5 MG/3ML) 0.083% nebulizer solution Take 2.5 mg by nebulization every 6 (six) hours as needed for shortness of breath.    Yes Historical Provider, MD  amitriptyline (ELAVIL) 75 MG tablet Take 75 mg by mouth at bedtime.   Yes Historical Provider, MD  DULoxetine (CYMBALTA) 60 MG capsule Take 60 mg by mouth daily.     Yes Historical Provider, MD  ferrous fumarate (HEMOCYTE - 106 MG FE) 325 (106 FE) MG TABS tablet Take 1 tablet by mouth daily.   Yes Historical Provider, MD  fluticasone (FLONASE) 50 MCG/ACT nasal  spray Place 1 spray into the nose daily as needed for rhinitis or allergies.  11/24/12  Yes Historical Provider, MD  lidocaine (XYLOCAINE) 2 % solution Use as directed 30 mLs in the mouth or throat daily as needed. 05/01/14  Yes Historical Provider, MD  Linaclotide (LINZESS) 290 MCG CAPS capsule Take 290 mcg by mouth daily as needed (for stomach).   Yes Historical Provider, MD  LORazepam (ATIVAN) 2 MG tablet Take 2 mg by mouth at bedtime.    Yes Historical Provider, MD  meclizine (ANTIVERT) 25 MG tablet Take 25 mg by mouth 2 (two) times daily.  04/18/14  Yes Historical Provider, MD  metFORMIN (GLUCOPHAGE) 500 MG tablet Take 500 mg by mouth 2 (two) times daily. 04/30/14  Yes Historical Provider, MD  metoprolol succinate (TOPROL-XL) 25 MG 24 hr tablet Take 1 tablet (25 mg total) by mouth daily. 09/01/13  Yes Herminio Commons, MD  Multiple Vitamin (MULTIVITAMIN WITH MINERALS) TABS tablet Take 1 tablet by mouth daily.   Yes Historical Provider, MD  oxyCODONE-acetaminophen (PERCOCET) 10-325 MG per tablet Take 1 tablet by mouth every 6 (six) hours as needed for pain.  04/01/14  Yes Historical Provider, MD  pantoprazole (PROTONIX) 40 MG tablet Take 1 tablet (40 mg total) by mouth 2 (two) times daily. 03/31/13  Yes Danie Binder, MD  pravastatin (PRAVACHOL) 40 MG tablet Take 40 mg by mouth every morning.    Yes Historical Provider, MD  promethazine (PHENERGAN) 25 MG tablet Take 25 mg by mouth every 6 (six) hours as needed for nausea or vomiting.   Yes Historical Provider, MD  QUEtiapine (SEROQUEL) 400 MG tablet Take 400 mg by mouth at bedtime.  11/24/12  Yes Historical Provider, MD  testosterone (ANDROGEL) 50 MG/5GM GEL Place 5 g onto the skin daily. Applied to either shoulder   Yes Historical Provider, MD   Triage Vitals: BP 109/86  Pulse 102  Temp(Src) 98.9 F (37.2 C) (Oral)  Resp 11  Wt 200 lb (90.719 kg)  SpO2 96%   Physical Exam  Nursing note and vitals reviewed. Constitutional: He is oriented to person,  place, and time. He appears well-developed and well-nourished.  HENT:  Head: Normocephalic and atraumatic.  Dry mucous membranes  Eyes: EOM are normal.  Neck: Normal range of motion.  Cardiovascular: Normal rate, regular rhythm, normal heart sounds and intact distal pulses.   Pulmonary/Chest: Effort normal and breath sounds normal. No respiratory distress.  Abdominal: Soft. He exhibits no distension. There is tenderness.  Tenderness to palpation across upper abdomen: L, middle, and R  Musculoskeletal: Normal range of motion.  Neurological: He is alert and oriented to person, place, and time.  Skin: Skin is warm and dry.  Psychiatric: He has a normal mood and affect. Judgment normal.    ED Course  Procedures (including critical care time)  DIAGNOSTIC  STUDIES: Oxygen Saturation is 94% on ra, adequate by my interpretation.    COORDINATION OF CARE: 2:41 PM- Will give fluids and Morphine. Will order lab work. Discussed treatment plan with pt at bedside and pt agreed to plan.     Labs Review Labs Reviewed  COMPREHENSIVE METABOLIC PANEL - Abnormal; Notable for the following:    Sodium 136 (*)    Potassium 3.6 (*)    Chloride 94 (*)    BUN 26 (*)    Creatinine, Ser 1.45 (*)    Albumin 3.4 (*)    GFR calc non Af Amer 51 (*)    GFR calc Af Amer 59 (*)    All other components within normal limits  URINALYSIS, ROUTINE W REFLEX MICROSCOPIC - Abnormal; Notable for the following:    Bilirubin Urine SMALL (*)    Leukocytes, UA TRACE (*)    All other components within normal limits  URINE MICROSCOPIC-ADD ON - Abnormal; Notable for the following:    Squamous Epithelial / LPF FEW (*)    Bacteria, UA FEW (*)    All other components within normal limits  LIPASE, BLOOD  CBC    Imaging Review Dg Esophagus  05/13/2014   CLINICAL DATA:  Dysphagia. History of achalasia status post myotomy. History of esophageal stricture.  EXAM: ESOPHOGRAM/BARIUM SWALLOW  TECHNIQUE: Single contrast  examination was performed using  thin barium.  FLUOROSCOPY TIME:  1 min 42 seconds  COMPARISON:  Chest CT 04/30/2014.  Barium swallow 07/24/2011.  FINDINGS: Patient vomited immediately prior to the examination on ultrasound. Only a single contrast examination was performed with the patient in the upright position.  Evaluation of esophageal motility was limited due to upright positioning, however very little to no primary peristalsis was seen. Marked tertiary contractions demonstrated on the prior swallow study were not observed on the current examination, with at most only mild tertiary contractions intermittently observed distally.  The thoracic esophagus was patulous. Contrast pooled in the distal esophagus, which demonstrated marked tapered narrowing distally. None of the initial contrast bolus passed through the GE junction into the stomach. After an additional swallow of contrast to increase the barium column in the esophagus, a small stream of contrast passed through into the stomach, however the majority of the contrast remained in the distal esophagus with only a small amount of additional contrast passing intermittently into the stomach during additional observation. Esophageal narrowing at the GE junction appears more severe than on the prior swallow study.  The patient attempted to swallow a barium tablet but was unable to and vomited. The examination was terminated at this point.  IMPRESSION: Severe narrowing of the distal esophagus near the GE junction, greater than on the 2012 study, with only a small amount of contrast intermittently passing into the stomach. Patulous, hypotonic thoracic esophagus consistent with achalasia.   Electronically Signed   By: Logan Bores   On: 05/13/2014 09:56   US Abdomen Limited Ruq  05/13/2014   CLINICAL DATA:  Right upper quadrant abdominal pain with vomiting. History of diabetes.  EXAM: US ABDOMEN LIMITED - RIGHT UPPER QUADRANT  COMPARISON:  Chest CTA 04/30/2014.   FINDINGS: Gallbladder:  Suboptimally visualized due to bowel gas. No gallbladder wall thickening, pericholecystic fluid or gallstones are identified. Negative sonographic Murphy sign.  Common bile duct:  Diameter: Suboptimally visualized, but grossly normal in caliber, measuring approximately 3.6 mm.  Liver:  Hepatic echogenicity is diffusely increased, corresponding with steatosis on prior CT. The liver measures at least 16.1  cm in height.  IMPRESSION: 1. No evidence of gallbladder or biliary disease. 2. Hepatic steatosis.   Electronically Signed   By: Camie Patience M.D.   On: 05/13/2014 09:20      MDM   Final diagnoses:  Achalasia  Dehydration    Laboratory workup shows that the patient appears to becoming more dehydrated over time, his BUN is gradually creeping up and his creatinine is elevated to 1.45. This is likely due to dehydration. He has no other acute findings on his laboratory workup to suggest another cause for his symptoms and has had multiple other studies including abdominal ultrasound and CT scan in the last 2 weeks neither of which showed an alternative diagnosis to achalasia. I will discuss his care with the gastroenterologist. History on 12  Discussed with Dr. Oneida Alar who recommends admission to the hospital for hydration, she will see the patient in the morning  Discussed with Dr. Sarajane Jews who will admit the patient.  Meds given in ED:  Medications  0.9 %  sodium chloride infusion ( Intravenous New Bag/Given 05/14/14 1342)  morphine 4 MG/ML injection 4 mg (4 mg Intravenous Given 05/14/14 1342)  morphine 4 MG/ML injection 4 mg (4 mg Intravenous Given 05/14/14 1417)    New Prescriptions   No medications on file      I personally performed the services described in this documentation, which was scribed in my presence. The recorded information has been reviewed and is accurate.    Johnna Acosta, MD 05/14/14 [phone]

## 2014-05-14 NOTE — ED Notes (Signed)
Pt reports abd pain/ diarrhea/ decreased appetite x 1 year worse over the last month. Pt describes stools ask black

## 2014-05-15 DIAGNOSIS — E860 Dehydration: Secondary | ICD-10-CM

## 2014-05-15 DIAGNOSIS — K220 Achalasia of cardia: Principal | ICD-10-CM

## 2014-05-15 LAB — GLUCOSE, CAPILLARY
GLUCOSE-CAPILLARY: 128 mg/dL — AB (ref 70–99)
GLUCOSE-CAPILLARY: 85 mg/dL (ref 70–99)
Glucose-Capillary: 86 mg/dL (ref 70–99)
Glucose-Capillary: 113 mg/dL — ABNORMAL HIGH (ref 70–99)

## 2014-05-15 LAB — BASIC METABOLIC PANEL
ANION GAP: 10 (ref 5–15)
BUN: 21 mg/dL (ref 6–23)
CO2: 29 meq/L (ref 19–32)
CREATININE: 1.21 mg/dL (ref 0.50–1.35)
Calcium: 8.3 mg/dL — ABNORMAL LOW (ref 8.4–10.5)
Chloride: 100 mEq/L (ref 96–112)
GFR calc Af Amer: 73 mL/min — ABNORMAL LOW (ref >90)
GFR calc non Af Amer: 63 mL/min — ABNORMAL LOW (ref >90)
Glucose, Bld: 74 mg/dL (ref 70–99)
Potassium: 4.2 mEq/L (ref 3.7–5.3)
Sodium: 139 mEq/L (ref 137–147)

## 2014-05-15 MED ORDER — PANTOPRAZOLE SODIUM 40 MG IV SOLR
40.0000 mg | Freq: Two times a day (BID) | INTRAVENOUS | Status: DC
  Administered 2014-05-15 – 2014-05-16 (×2): 40 mg via INTRAVENOUS
  Filled 2014-05-15 (×2): qty 40

## 2014-05-15 MED ORDER — LIDOCAINE VISCOUS 2 % MT SOLN
10.0000 mL | Freq: Three times a day (TID) | OROMUCOSAL | Status: DC
  Administered 2014-05-15 – 2014-05-16 (×5): 10 mL via OROMUCOSAL
  Filled 2014-05-15 (×5): qty 15

## 2014-05-15 NOTE — Progress Notes (Signed)
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 rate 19, height 6\' 1"  (1.854 m), weight 195 lb 1.7 oz (88.5 kg), SpO2 94.00%. patient appears anxious lungs clear to A&P no rales rhonchi heart regular rhythm no*when easels rubs abdomen soft nontender bowel sounds normoactive no peristaltic rushes no guarding or rebound   Investigations:  No results found for this or any previous visit (from the past 240 hour(s)).   Basic Metabolic Panel:  Recent Labs  05/14/14 1336 05/15/14 0559  NA 136* 139  K 3.6* 4.2  CL 94* 100  CO2 29 29  GLUCOSE 95 74  BUN 26* 21  CREATININE 1.45* 1.21  CALCIUM 9.4 8.3*   Liver Function Tests:  Recent Labs  05/14/14 1336  AST 20  ALT 18  ALKPHOS 73  BILITOT 0.5  PROT 7.4  ALBUMIN 3.4*     CBC:  Recent Labs  05/14/14 1336  WBC 9.2  HGB 15.0  HCT 43.1  MCV 88.1  PLT 250    No results found.    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 Principal Problem:   Abdominal pain Active Problems:   Achalasia   Dehydration   Hypokalemia   DM type 2 (diabetes mellitus, type 2)     Plan: Clear liquids only. A GI consult is considering surgery at [facility]. Will stenosis and a chalasia. Continue PPI   Consultants: GI Dr. fields   Procedures none.   Antibiotics none.                  Code Status: Full.   Family Communication:  Discussed with patient and wife at bedside  Disposition Plan advance to clear liquids. Surgical consult at [facility]  Time spent: 30  minutes   LOS: 1 day   Tram Wrenn M   05/15/2014, 12:06 PM

## 2014-05-15 NOTE — Progress Notes (Signed)
Quick Note:  Findings c/w achalasia. Patient currently inpatient. ______

## 2014-05-15 NOTE — Consult Note (Signed)
Referring Provider: No ref. provider found Primary Care Physician:  Maricela Curet, MD Primary Gastroenterologist:  Barney Drain  Reason for Consultation:  DYSPHAGIA   Impression: ADMITTED WITH DYSPHAGIA AND WEIGHT LOSS. LAST EGD/DIL JUL 2014 ESOPHAGITIS.  RECOMMENDED PT SEE DR. Garner Nash. PT FAILED TO SEE DR. Garner Nash AND HAS HAD WORSENING DYSPHAGIA AND WEIGHT LOSS OVER PAST 6 MOS. BPE SEP 19 SHOWS A DILATED ESOPHAGUS/ FAILS TO DRAIN THIN BARIUM.  Plan: 1. CLEAR LIQUID DIET 2. CRUSH MEDS WHEN POSSIBLE. GIVE WITH APPLESAUCE 3. DISCUSSED WITH DR. MCNATT. ACCEPTED PT IN TRANSFER TOMORROW TO BE EVALUATAED BY DR.WATERS. 4. BID PPI 5. VISCOUS LIDOCAINE QAC AND HS 6. ZOFRANPRN  HPI:  PT LAST SEEN AND EVALUATED AT [place] 2014. PMHx: ACHALASIA. PT HAS HAD A STEADY DECLINE IN THE PAST 6 MOS. REPORTS 40 LB WEIGHT LOSS.  LAST WEIGHT APR 2014: 209 LBS. C/O CHEST PAIN AND CONSTIPATION. HAD BLACK WATERY STOOLS x 23-4 DAYS AGO. EATS AND IMMEDIATELY VOMITS. HASN'T TOLERATED SOLID FOOD IN 1 MO. BARELY TOLERATES LIQUIDS. C/OEPIGASTRIC PAIN AS WELL(SHARP/RIPPING)shortness of breath 2-3X/WEEK. SUBJECTIVE CHILLS. NO CIGARETTES OR ETOH, BUT CHEW TOBACCO. LAST VOMITED 2-3 DAYS AGO AND SAW SML AMOUNT OF BLOOD.PT DENIES  HEMATOCHEZIA.  Past Medical History  Diagnosis Date  . DDD (degenerative disc disease)   . GERD (gastroesophageal reflux disease)   . COPD (chronic obstructive pulmonary disease)   . Hypercholesterolemia   . Anxiety   . Achalasia 2008 168 LBS  . Chronic abdominal pain     AUG 2010 CT ABD W/ IVC-NL PANCREAS, GB, LIVER  . Hypercholesteremia   . Dysphagia   . Diabetes mellitus without complication     Past Surgical History  Procedure Laterality Date  . Heller myotomy  DEC 2008 DR. CARL WESTCOTT  . Esophagogastroduodenoscopy       MAR 2012 (RMR) TTS DIL 20 MM, JAN 2010 Gastritis  . Hemorrhoid surgery    . Colonoscopy  2009 [facility]    Normal  . Savory dilation  05/03/2011     Stricture/mild gastritis  . Esophagogastroduodenoscopy N/A 03/01/2013    DJS:[id number] body of esophagus secondary to achalasia/Ulcerative reflux esophagitis felt to be source of patient's upper GI bleed/Erosive gastroduodenitis. NORMAL H.plyori serologies  . Colonoscopy N/A 03/19/2013    SLF:11 COLORECTAL POLYPS REMOVED/RECTAL BLEEDING DUE TO INTERNAL HEMORRHOIDS. TAs. next TCS 02/2018    Prior to Admission medications   Medication Sig Start Date End Date Taking? Authorizing Provider  albuterol (PROVENTIL) (2.5 MG/3ML) 0.083% nebulizer solution Take 2.5 mg by nebulization every 6 (six) hours as needed for shortness of breath.    Yes Historical Provider, MD  amitriptyline (ELAVIL) 75 MG tablet Take 75 mg by mouth at bedtime.   Yes Historical Provider, MD  DULoxetine (CYMBALTA) 60 MG capsule Take 60 mg by mouth daily.     Yes Historical Provider, MD  ferrous fumarate (HEMOCYTE - 106 MG FE) 325 (106 FE) MG TABS tablet Take 1 tablet by mouth daily.   Yes Historical Provider, MD  fluticasone (FLONASE) 50 MCG/ACT nasal spray Place 1 spray into the nose daily as needed for rhinitis or allergies.  11/24/12  Yes Historical Provider, MD  lidocaine (XYLOCAINE) 2 % solution Use as directed 30 mLs in the mouth or throat daily as needed. 05/01/14  Yes Historical Provider, MD  Linaclotide (LINZESS) 290 MCG CAPS capsule Take 290 mcg by mouth daily as needed (for stomach).   Yes Historical Provider, MD  LORazepam (ATIVAN) 2 MG tablet Take 2 mg by  mouth at bedtime.    Yes Historical Provider, MD  meclizine (ANTIVERT) 25 MG tablet Take 25 mg by mouth 2 (two) times daily.  04/18/14  Yes Historical Provider, MD  metFORMIN (GLUCOPHAGE) 500 MG tablet Take 500 mg by mouth 2 (two) times daily. 04/30/14  Yes Historical Provider, MD  metoprolol succinate (TOPROL-XL) 25 MG 24 hr tablet Take 1 tablet (25 mg total) by mouth daily. 09/01/13  Yes Herminio Commons, MD  Multiple Vitamin (MULTIVITAMIN WITH MINERALS) TABS tablet Take 1 tablet  by mouth daily.   Yes Historical Provider, MD  oxyCODONE-acetaminophen (PERCOCET) 10-325 MG per tablet Take 1 tablet by mouth every 6 (six) hours as needed for pain.  04/01/14  Yes Historical Provider, MD  pantoprazole (PROTONIX) 40 MG tablet Take 1 tablet (40 mg total) by mouth 2 (two) times daily. 03/31/13  Yes Danie Binder, MD  pravastatin (PRAVACHOL) 40 MG tablet Take 40 mg by mouth every morning.    Yes Historical Provider, MD  promethazine (PHENERGAN) 25 MG tablet Take 25 mg by mouth every 6 (six) hours as needed for nausea or vomiting.   Yes Historical Provider, MD  QUEtiapine (SEROQUEL) 400 MG tablet Take 400 mg by mouth at bedtime.  11/24/12  Yes Historical Provider, MD  testosterone (ANDROGEL) 50 MG/5GM GEL Place 5 g onto the skin daily. Applied to either shoulder   Yes Historical Provider, MD    Current Facility-Administered Medications  Medication Dose Route Frequency Provider Last Rate Last Dose  . 0.9 %  sodium chloride infusion   Intravenous Continuous Samuella Cota, MD 75 mL/hr at 05/15/14 [phone]    . acetaminophen (TYLENOL) tablet 650 mg  650 mg Oral Q6H PRN Samuella Cota, MD       Or  . acetaminophen (TYLENOL) suppository 650 mg  650 mg Rectal Q6H PRN Samuella Cota, MD      . albuterol (PROVENTIL) (2.5 MG/3ML) 0.083% nebulizer solution 2.5 mg  2.5 mg Nebulization Q6H PRN Samuella Cota, MD      . amitriptyline (ELAVIL) tablet 75 mg  75 mg Oral QHS Samuella Cota, MD   75 mg at 05/14/14 2023  . antiseptic oral rinse (CPC / CETYLPYRIDINIUM CHLORIDE 0.05%) solution 7 mL  7 mL Mouth Rinse q12n4p Samuella Cota, MD      . chlorhexidine (PERIDEX) 0.12 % solution 15 mL  15 mL Mouth Rinse BID Samuella Cota, MD   15 mL at 05/15/14 0817  . DULoxetine (CYMBALTA) DR capsule 60 mg  60 mg Oral Daily Samuella Cota, MD      . fluconazole (DIFLUCAN) IVPB 100 mg  100 mg Intravenous Q24H Samuella Cota, MD      . HYDROmorphone (DILAUDID) injection 1 mg  1 mg Intravenous  Q4H PRN Tresa Garter, MD   1 mg at 05/15/14 0817  . Influenza vac split quadrivalent PF (FLUARIX) injection 0.5 mL  0.5 mL Intramuscular Tomorrow-1000 Samuella Cota, MD      . insulin aspart (novoLOG) injection 0-9 Units  0-9 Units Subcutaneous TID WC Samuella Cota, MD      . LORazepam (ATIVAN) tablet 2 mg  2 mg Oral BID AC Maricela Curet, MD   2 mg at 05/15/14 0817  . metoprolol succinate (TOPROL-XL) 24 hr tablet 25 mg  25 mg Oral Daily Samuella Cota, MD      . ondansetron [facility]) tablet 4 mg  4 mg Oral Q6H PRN Melene Plan  Sarajane Jews, MD       Or  . ondansetron [facility]) injection 4 mg  4 mg Intravenous Q6H PRN Samuella Cota, MD      . pantoprazole (PROTONIX) injection 40 mg  40 mg Intravenous Q12H Samuella Cota, MD   40 mg at 05/14/14 2127  . pneumococcal 23 valent vaccine (PNU-IMMUNE) injection 0.5 mL  0.5 mL Intramuscular Tomorrow-1000 Samuella Cota, MD        Allergies as of 05/14/2014 - Review Complete 05/14/2014  Allergen Reaction Noted  . Dairy aid [lactase] Nausea And Vomiting 12/06/2010    Family History  Problem Relation Age of Onset  . Colon cancer Neg Hx   . Colon polyps Neg Hx   . Anesthesia problems Neg Hx   . Hypotension Neg Hx   . Malignant hyperthermia Neg Hx   . Pseudochol deficiency Neg Hx   . Cancer      family history  . Heart attack      family history  . Seizures Mother   . Seizures Father   . Seizures Sister      History   Social History  . Marital Status: Married    Spouse Name: N/A    Number of Children: N/A  . Years of Education: N/A   Occupational History  . Not on file.   Social History Main Topics  . Smoking status: Former Smoker -- 1.00 packs/day    Types: Cigarettes    Quit date: 08/27/1995  . Smokeless tobacco: Current User    Types: Chew  . Alcohol Use: No  . Drug Use: No  . Sexual Activity: Not Currently   Other Topics Concern  . Not on file   Social History Narrative  . No narrative on file     Review of Systems: PER HPI OTHERWISE ALL SYSTEMS ARE NEGATIVE.   Vitals: Blood pressure 94/64, pulse 77, temperature 97.5 F (36.4 C), temperature source Oral, resp. rate 19, height 6\' 1"  (1.854 m), weight 195 lb 1.7 oz (88.5 kg), SpO2 94.00%.  Physical Exam: General:   Alert,  Well-developed, well-nourished, pleasant and cooperative in NAD Head:  Normocephalic and atraumatic. Eyes:  Sclera clear, no icterus.   Conjunctiva pink. Mouth:  No lesions, dentition ABnormal. Neck:  Supple; no masses. Lungs:  Clear throughout to auscultation.   No wheezes. No acute distress. Heart:  Regular rate and rhythm; no murmurs. Abdomen:  Soft, tender  MILD TO MODERATE IN EPIGASTRIUM, RUQ AND RLQ, nondistended. No masses, noted. Normal bowel sounds, without guarding, and without rebound.   Msk:  Symmetrical without gross deformities. Normal posture. Extremities:  Without edema. Neurologic:  Alert and  oriented x4;  grossly normal neurologically. Cervical Nodes:  No significant cervical adenopathy. Psych:  Alert and cooperative. ANXIOUS mood and FLAT affect.   Lab Results:  Recent Labs  05/14/14 1336  WBC 9.2  HGB 15.0  HCT 43.1  PLT 250   BMET  Recent Labs  05/14/14 1336 05/15/14 0559  NA 136* 139  K 3.6* 4.2  CL 94* 100  CO2 29 29  GLUCOSE 95 74  BUN 26* 21  CREATININE 1.45* 1.21  CALCIUM 9.4 8.3*   LFT  Recent Labs  05/14/14 1336  PROT 7.4  ALBUMIN 3.4*  AST 20  ALT 18  ALKPHOS 73  BILITOT 0.5     Studies/Results: PER HPI   LOS: 1 day   Walter Herring  05/15/2014, 10:17 AM

## 2014-05-15 NOTE — Progress Notes (Signed)
Quick Note:  Hepatic steatosis. Patient inpatient. ______

## 2014-05-16 LAB — GLUCOSE, CAPILLARY
Glucose-Capillary: 83 mg/dL (ref 70–99)
Glucose-Capillary: 176 mg/dL — ABNORMAL HIGH (ref 70–99)

## 2014-05-16 LAB — BASIC METABOLIC PANEL
Anion gap: 7 (ref 5–15)
BUN: 13 mg/dL (ref 6–23)
CO2: 30 mEq/L (ref 19–32)
Calcium: 8 mg/dL — ABNORMAL LOW (ref 8.4–10.5)
Chloride: 104 mEq/L (ref 96–112)
Creatinine, Ser: 1.01 mg/dL (ref 0.50–1.35)
GFR calc Af Amer: >90 mL/min (ref >90)
GFR calc non Af Amer: 79 mL/min — ABNORMAL LOW (ref >90)
Glucose, Bld: 87 mg/dL (ref 70–99)
Potassium: 4.4 mEq/L (ref 3.7–5.3)
Sodium: 141 mEq/L (ref 137–147)

## 2014-05-16 MED ORDER — HYDROMORPHONE HCL 1 MG/ML IJ SOLN
1.0000 mg | INTRAMUSCULAR | Status: DC | PRN
  Administered 2014-05-16 (×2): 1 mg via INTRAVENOUS
  Filled 2014-05-16 (×3): qty 1

## 2014-05-16 MED ORDER — LORAZEPAM 2 MG PO TABS: 2.0000 mg | ORAL_TABLET | Freq: Two times a day (BID) | ORAL | Status: AC

## 2014-05-16 NOTE — Clinical Social Work Note (Signed)
CSW received referral, for unclear reasons. Pt is transferring to [facility] today.   Walter Herring, [place]

## 2014-05-16 NOTE — Discharge Summary (Signed)
rmd    Physician Discharge Summary  Walter Herring YKZ:[id number] DOB: 06/28/1956 DOA: 05/14/2014  PCP: Maricela Curet, MD  Admit date: 05/14/2014 Discharge date: 05/16/2014   Recommendations for Outpatient Follow-up:  Patient is being discharged and recommendations gastroenterology 4 followup in [facility] surgery service or definitive surgery for gastroesophageal junction stenosis as well as severe achalasia with symptomatology. A light lightheaded A. hyperlipidemia severe anxiety disorder bipolar manic features GERD.    Physician Discharge Summary  Walter Herring [id number] DOB: 01-11-1956 DOA: 05/14/2014  PCP: Maricela Curet, MD  Admit date: 05/14/2014 Discharge date: 05/16/2014   Recommendations for Outpatient Follow-up:  Patient will follow up at [facility] GI and surgical services already arranged for consideration of definitive surgery for gastroesophageal junction restenosis as well as achalasia Discharge Diagnoses:  Principal Problem:   Abdominal pain Active Problems:   Achalasia   Dehydration   Hypokalemia   DM type 2 (diabetes mellitus, type 2)   Discharge Condition: Good.  Filed Weights   05/14/14 1251 05/14/14 1605  Weight: 200 lb (90.719 kg) 195 lb 1.7 oz (88.5 kg)    History of present illness:  The patient was admitted with signs of recurrent vomiting epigastric and diffuse abdominal pain for 2 months unit in achalasia recent barium follow revealed significant junction stenosis concomitantly no dilatation was performed with dilatation performed 2 years patient to gastroenterology had nausea controlled with antiemetics and EPI continued on board and clinically diet he had diminution of symptomatology but no resolution. He is hemodynamically stable and hospital had some moderate anxiety his blood pressure and lipids were called controlled. Likewise his glucose was normal glycemic on clear liquid diet.  Hospital  Course:  Patient had mild control of his symptomatology was clear liquid diet as well as Protonix and was recommended to be considered for definitive surgery at [facility]. Electrolytes were within normal parameters upon discharge.  Procedures:  CAT scan of abdomen which was essentially unrevealing.   Consultations: gastroenterology Dr. Oneida Alar   Discharge Instructions     Medication List    STOP taking these medications       LINZESS 290 MCG Caps capsule  Generic drug:  Linaclotide     meclizine 25 MG tablet  Commonly known as:  ANTIVERT     promethazine 25 MG tablet  Commonly known as:  PHENERGAN     testosterone 50 MG/5GM (1%) Gel  Commonly known as:  ANDROGEL      TAKE these medications       albuterol (2.5 MG/3ML) 0.083% nebulizer solution  Commonly known as:  PROVENTIL  Take 2.5 mg by nebulization every 6 (six) hours as needed for shortness of breath.     amitriptyline 75 MG tablet  Commonly known as:  ELAVIL  Take 75 mg by mouth at bedtime.     DULoxetine 60 MG capsule  Commonly known as:  CYMBALTA  Take 60 mg by mouth daily.     ferrous fumarate 325 (106 FE) MG Tabs tablet  Commonly known as:  HEMOCYTE - 106 mg FE  Take 1 tablet by mouth daily.     fluticasone 50 MCG/ACT nasal spray  Commonly known as:  FLONASE  Place 1 spray into the nose daily as needed for rhinitis or allergies.     lidocaine 2 % solution  Commonly known as:  XYLOCAINE  Use as directed 30 mLs in the mouth or throat daily as needed.  LORazepam 2 MG tablet  Commonly known as:  ATIVAN  Take 1 tablet (2 mg total) by mouth 2 (two) times daily.     metFORMIN 500 MG tablet  Commonly known as:  GLUCOPHAGE  Take 500 mg by mouth 2 (two) times daily.     metoprolol succinate 25 MG 24 hr tablet  Commonly known as:  TOPROL-XL  Take 1 tablet (25 mg total) by mouth daily.     multivitamin with minerals Tabs tablet  Take 1 tablet by mouth daily.      oxyCODONE-acetaminophen 10-325 MG per tablet  Commonly known as:  PERCOCET  Take 1 tablet by mouth every 6 (six) hours as needed for pain.     pantoprazole 40 MG tablet  Commonly known as:  PROTONIX  Take 1 tablet (40 mg total) by mouth 2 (two) times daily.     pravastatin 40 MG tablet  Commonly known as:  PRAVACHOL  Take 40 mg by mouth every morning.     QUEtiapine 400 MG tablet  Commonly known as:  SEROQUEL  Take 400 mg by mouth at bedtime.       Allergies  Allergen Reactions  . Dairy Aid [Lactase] Nausea And Vomiting      The results of significant diagnostics from this hospitalization (including imaging, microbiology, ancillary and laboratory) are listed below for reference.    Significant Diagnostic Studies: Dg Chest 2 View  04/30/2014   CLINICAL DATA:  Chest pain for several days, shortness of breath, vomiting blood and passing blood in stool. History of esophageal achalasia.  EXAM: CHEST  2 VIEW  COMPARISON:  03/23/2013; 02/27/2013; CT abdomen pelvis - 03/02/2013  FINDINGS: Grossly unchanged borderline enlarged cardiac silhouette and mediastinal contours. The lungs remain hyperexpanded. No focal airspace opacities. No pleural effusion or pneumothorax. No evidence of edema. No acute osseus abnormalities. Regional soft tissues appear normal.  IMPRESSION: Hyperexpanded lungs without acute cardiopulmonary disease.   Electronically Signed   By: Sandi Mariscal M.D.   On: 04/30/2014 16:12   Ct Angio Chest Pe W/cm &/or Wo Cm  04/30/2014   CLINICAL DATA:  Rule out pulmonary embolism versus aortic dissection. Chest pain and hemoptysis.  EXAM: CT ANGIOGRAPHY CHEST WITH CONTRAST  TECHNIQUE: Multidetector CT imaging of the chest was performed using the standard protocol during bolus administration of intravenous contrast. Multiplanar CT image reconstructions and MIPs were obtained to evaluate the vascular anatomy.  CONTRAST:  16mL OMNIPAQUE IOHEXOL 350 MG/ML SOLN  COMPARISON:  CT abdomen  03/02/2013 and 04/21/2009.  FINDINGS: Lungs are adequately inflated with mild emphysematous disease. There is a small right pleural effusion with mild associated atelectasis. There is a 5 mm nodule over the medial right lower lobe unchanged from 2010. The heart is normal in size. There is minimal calcified plaque over the left anterior descending coronary artery. There is no evidence of thoracic aortic aneurysm or dissection. There is no evidence of pulmonary embolism. There is no mediastinal, hilar or axillary adenopathy. There is persistent fluid-filled dilatation of the esophagus unchanged from 2010.  Images through the upper abdomen are unremarkable. There is mild spondylosis of the spine.  Review of the MIP images confirms the above findings.  IMPRESSION: No evidence of pulmonary embolism or aortic dissection.  Small amount right pleural fluid with associated basilar atelectasis. Minimal emphysematous disease.  Stable fluid-filled dilatation of the esophagus which may be due to achalasia.  Minimal atherosclerotic coronary artery disease.   Electronically Signed   By: Marin Olp M.D.  On: 04/30/2014 21:04   Dg Esophagus  05/13/2014   CLINICAL DATA:  Dysphagia. History of achalasia status post myotomy. History of esophageal stricture.  EXAM: ESOPHOGRAM/BARIUM SWALLOW  TECHNIQUE: Single contrast examination was performed using  thin barium.  FLUOROSCOPY TIME:  1 min 42 seconds  COMPARISON:  Chest CT 04/30/2014.  Barium swallow 07/24/2011.  FINDINGS: Patient vomited immediately prior to the examination on ultrasound. Only a single contrast examination was performed with the patient in the upright position.  Evaluation of esophageal motility was limited due to upright positioning, however very little to no primary peristalsis was seen. Marked tertiary contractions demonstrated on the prior swallow study were not observed on the current examination, with at most only mild tertiary contractions intermittently  observed distally.  The thoracic esophagus was patulous. Contrast pooled in the distal esophagus, which demonstrated marked tapered narrowing distally. None of the initial contrast bolus passed through the GE junction into the stomach. After an additional swallow of contrast to increase the barium column in the esophagus, a small stream of contrast passed through into the stomach, however the majority of the contrast remained in the distal esophagus with only a small amount of additional contrast passing intermittently into the stomach during additional observation. Esophageal narrowing at the GE junction appears more severe than on the prior swallow study.  The patient attempted to swallow a barium tablet but was unable to and vomited. The examination was terminated at this point.  IMPRESSION: Severe narrowing of the distal esophagus near the GE junction, greater than on the 2012 study, with only a small amount of contrast intermittently passing into the stomach. Patulous, hypotonic thoracic esophagus consistent with achalasia.   Electronically Signed   By: Logan Bores   On: 05/13/2014 09:56   US Abdomen Limited Ruq  05/13/2014   CLINICAL DATA:  Right upper quadrant abdominal pain with vomiting. History of diabetes.  EXAM: US ABDOMEN LIMITED - RIGHT UPPER QUADRANT  COMPARISON:  Chest CTA 04/30/2014.  FINDINGS: Gallbladder:  Suboptimally visualized due to bowel gas. No gallbladder wall thickening, pericholecystic fluid or gallstones are identified. Negative sonographic Murphy sign.  Common bile duct:  Diameter: Suboptimally visualized, but grossly normal in caliber, measuring approximately 3.6 mm.  Liver:  Hepatic echogenicity is diffusely increased, corresponding with steatosis on prior CT. The liver measures at least 16.1 cm in height.  IMPRESSION: 1. No evidence of gallbladder or biliary disease. 2. Hepatic steatosis.   Electronically Signed   By: Camie Patience M.D.   On: 05/13/2014 09:20     Microbiology: No results found for this or any previous visit (from the past 240 hour(s)).   Labs: Basic Metabolic Panel:  Recent Labs Lab 05/14/14 1336 05/15/14 0559 05/16/14 0729  NA 136* 139 141  K 3.6* 4.2 4.4  CL 94* 100 104  CO2 29 29 30   GLUCOSE 95 74 87  BUN 26* 21 13  CREATININE 1.45* 1.21 1.01  CALCIUM 9.4 8.3* 8.0*   Liver Function Tests:  Recent Labs Lab 05/14/14 1336  AST 20  ALT 18  ALKPHOS 73  BILITOT 0.5  PROT 7.4  ALBUMIN 3.4*    Recent Labs Lab 05/14/14 1336  LIPASE 50   No results found for this basename: AMMONIA,  in the last 168 hours CBC:  Recent Labs Lab 05/14/14 1336  WBC 9.2  HGB 15.0  HCT 43.1  MCV 88.1  PLT 250   Cardiac Enzymes: No results found for this basename: CKTOTAL, CKMB, CKMBINDEX, TROPONINI,  in the last 168 hours BNP: BNP (last 3 results)  Recent Labs  04/30/14 1550  PROBNP 29.9   CBG:  Recent Labs Lab 05/14/14 1645 05/15/14 0727 05/15/14 1123 05/15/14 1623 05/15/14 2051  GLUCAP 150* 86 128* 113* 85       Signed:  Shamiah Kahler M  [facility] Pager: [phone] 05/16/2014, 12:37 PM Discharge Diagnoses:  Principal Problem:   Abdominal pain Active Problems:   Achalasia   Dehydration   Hypokalemia   DM type 2 (diabetes mellitus, type 2)   Discharge Condition:   Filed Weights   05/14/14 1251 05/14/14 1605  Weight: 200 lb (90.719 kg) 195 lb 1.7 oz (88.5 kg)    History of present illness:  See above.  Hospital Course: See above  Procedures:  See above.  Consultations:  Gastroenterology Dr. Oneida Alar.  Discharge Instructions     Medication List    STOP taking these medications       LINZESS 290 MCG Caps capsule  Generic drug:  Linaclotide     meclizine 25 MG tablet  Commonly known as:  ANTIVERT     promethazine 25 MG tablet  Commonly known as:  PHENERGAN     testosterone 50 MG/5GM (1%) Gel  Commonly known as:  ANDROGEL      TAKE these medications        albuterol (2.5 MG/3ML) 0.083% nebulizer solution  Commonly known as:  PROVENTIL  Take 2.5 mg by nebulization every 6 (six) hours as needed for shortness of breath.     amitriptyline 75 MG tablet  Commonly known as:  ELAVIL  Take 75 mg by mouth at bedtime.     DULoxetine 60 MG capsule  Commonly known as:  CYMBALTA  Take 60 mg by mouth daily.     ferrous fumarate 325 (106 FE) MG Tabs tablet  Commonly known as:  HEMOCYTE - 106 mg FE  Take 1 tablet by mouth daily.     fluticasone 50 MCG/ACT nasal spray  Commonly known as:  FLONASE  Place 1 spray into the nose daily as needed for rhinitis or allergies.     lidocaine 2 % solution  Commonly known as:  XYLOCAINE  Use as directed 30 mLs in the mouth or throat daily as needed.     LORazepam 2 MG tablet  Commonly known as:  ATIVAN  Take 1 tablet (2 mg total) by mouth 2 (two) times daily.     metFORMIN 500 MG tablet  Commonly known as:  GLUCOPHAGE  Take 500 mg by mouth 2 (two) times daily.     metoprolol succinate 25 MG 24 hr tablet  Commonly known as:  TOPROL-XL  Take 1 tablet (25 mg total) by mouth daily.     multivitamin with minerals Tabs tablet  Take 1 tablet by mouth daily.     oxyCODONE-acetaminophen 10-325 MG per tablet  Commonly known as:  PERCOCET  Take 1 tablet by mouth every 6 (six) hours as needed for pain.     pantoprazole 40 MG tablet  Commonly known as:  PROTONIX  Take 1 tablet (40 mg total) by mouth 2 (two) times daily.     pravastatin 40 MG tablet  Commonly known as:  PRAVACHOL  Take 40 mg by mouth every morning.     QUEtiapine 400 MG tablet  Commonly known as:  SEROQUEL  Take 400 mg by mouth at bedtime.       Allergies  Allergen Reactions  . Dairy Aid [Lactase] Nausea And  Vomiting      The results of significant diagnostics from this hospitalization (including imaging, microbiology, ancillary and laboratory) are listed below for reference.    Significant Diagnostic Studies: Dg Chest 2  View  04/30/2014   CLINICAL DATA:  Chest pain for several days, shortness of breath, vomiting blood and passing blood in stool. History of esophageal achalasia.  EXAM: CHEST  2 VIEW  COMPARISON:  03/23/2013; 02/27/2013; CT abdomen pelvis - 03/02/2013  FINDINGS: Grossly unchanged borderline enlarged cardiac silhouette and mediastinal contours. The lungs remain hyperexpanded. No focal airspace opacities. No pleural effusion or pneumothorax. No evidence of edema. No acute osseus abnormalities. Regional soft tissues appear normal.  IMPRESSION: Hyperexpanded lungs without acute cardiopulmonary disease.   Electronically Signed   By: Sandi Mariscal M.D.   On: 04/30/2014 16:12   Ct Angio Chest Pe W/cm &/or Wo Cm  04/30/2014   CLINICAL DATA:  Rule out pulmonary embolism versus aortic dissection. Chest pain and hemoptysis.  EXAM: CT ANGIOGRAPHY CHEST WITH CONTRAST  TECHNIQUE: Multidetector CT imaging of the chest was performed using the standard protocol during bolus administration of intravenous contrast. Multiplanar CT image reconstructions and MIPs were obtained to evaluate the vascular anatomy.  CONTRAST:  164mL OMNIPAQUE IOHEXOL 350 MG/ML SOLN  COMPARISON:  CT abdomen 03/02/2013 and 04/21/2009.  FINDINGS: Lungs are adequately inflated with mild emphysematous disease. There is a small right pleural effusion with mild associated atelectasis. There is a 5 mm nodule over the medial right lower lobe unchanged from 2010. The heart is normal in size. There is minimal calcified plaque over the left anterior descending coronary artery. There is no evidence of thoracic aortic aneurysm or dissection. There is no evidence of pulmonary embolism. There is no mediastinal, hilar or axillary adenopathy. There is persistent fluid-filled dilatation of the esophagus unchanged from 2010.  Images through the upper abdomen are unremarkable. There is mild spondylosis of the spine.  Review of the MIP images confirms the above findings.   IMPRESSION: No evidence of pulmonary embolism or aortic dissection.  Small amount right pleural fluid with associated basilar atelectasis. Minimal emphysematous disease.  Stable fluid-filled dilatation of the esophagus which may be due to achalasia.  Minimal atherosclerotic coronary artery disease.   Electronically Signed   By: Marin Olp M.D.   On: 04/30/2014 21:04   Dg Esophagus  05/13/2014   CLINICAL DATA:  Dysphagia. History of achalasia status post myotomy. History of esophageal stricture.  EXAM: ESOPHOGRAM/BARIUM SWALLOW  TECHNIQUE: Single contrast examination was performed using  thin barium.  FLUOROSCOPY TIME:  1 min 42 seconds  COMPARISON:  Chest CT 04/30/2014.  Barium swallow 07/24/2011.  FINDINGS: Patient vomited immediately prior to the examination on ultrasound. Only a single contrast examination was performed with the patient in the upright position.  Evaluation of esophageal motility was limited due to upright positioning, however very little to no primary peristalsis was seen. Marked tertiary contractions demonstrated on the prior swallow study were not observed on the current examination, with at most only mild tertiary contractions intermittently observed distally.  The thoracic esophagus was patulous. Contrast pooled in the distal esophagus, which demonstrated marked tapered narrowing distally. None of the initial contrast bolus passed through the GE junction into the stomach. After an additional swallow of contrast to increase the barium column in the esophagus, a small stream of contrast passed through into the stomach, however the majority of the contrast remained in the distal esophagus with only a small amount of additional contrast passing  intermittently into the stomach during additional observation. Esophageal narrowing at the GE junction appears more severe than on the prior swallow study.  The patient attempted to swallow a barium tablet but was unable to and vomited. The  examination was terminated at this point.  IMPRESSION: Severe narrowing of the distal esophagus near the GE junction, greater than on the 2012 study, with only a small amount of contrast intermittently passing into the stomach. Patulous, hypotonic thoracic esophagus consistent with achalasia.   Electronically Signed   By: Logan Bores   On: 05/13/2014 09:56   US Abdomen Limited Ruq  05/13/2014   CLINICAL DATA:  Right upper quadrant abdominal pain with vomiting. History of diabetes.  EXAM: US ABDOMEN LIMITED - RIGHT UPPER QUADRANT  COMPARISON:  Chest CTA 04/30/2014.  FINDINGS: Gallbladder:  Suboptimally visualized due to bowel gas. No gallbladder wall thickening, pericholecystic fluid or gallstones are identified. Negative sonographic Murphy sign.  Common bile duct:  Diameter: Suboptimally visualized, but grossly normal in caliber, measuring approximately 3.6 mm.  Liver:  Hepatic echogenicity is diffusely increased, corresponding with steatosis on prior CT. The liver measures at least 16.1 cm in height.  IMPRESSION: 1. No evidence of gallbladder or biliary disease. 2. Hepatic steatosis.   Electronically Signed   By: Camie Patience M.D.   On: 05/13/2014 09:20    Microbiology: No results found for this or any previous visit (from the past 240 hour(s)).   Labs: Basic Metabolic Panel:  Recent Labs Lab 05/14/14 1336 05/15/14 0559 05/16/14 0729  NA 136* 139 141  K 3.6* 4.2 4.4  CL 94* 100 104  CO2 29 29 30   GLUCOSE 95 74 87  BUN 26* 21 13  CREATININE 1.45* 1.21 1.01  CALCIUM 9.4 8.3* 8.0*   Liver Function Tests:  Recent Labs Lab 05/14/14 1336  AST 20  ALT 18  ALKPHOS 73  BILITOT 0.5  PROT 7.4  ALBUMIN 3.4*    Recent Labs Lab 05/14/14 1336  LIPASE 50   No results found for this basename: AMMONIA,  in the last 168 hours CBC:  Recent Labs Lab 05/14/14 1336  WBC 9.2  HGB 15.0  HCT 43.1  MCV 88.1  PLT 250   Cardiac Enzymes: No results found for this basename: CKTOTAL, CKMB,  CKMBINDEX, TROPONINI,  in the last 168 hours BNP: BNP (last 3 results)  Recent Labs  04/30/14 1550  PROBNP 29.9   CBG:  Recent Labs Lab 05/14/14 1645 05/15/14 0727 05/15/14 1123 05/15/14 1623 05/15/14 2051  GLUCAP 150* 86 128* 113* 85       Signed:  Latrica Clowers M  [facility] Pager: [phone] 05/16/2014, 12:36 PM

## 2014-05-16 NOTE — Progress Notes (Signed)
    Subjective: Unable to eat due to chest discomfort, epigastric pain. Intermittent nausea.   Objective: Vital signs in last 24 hours: Temp:  [98.2 F (36.8 C)-98.4 F (36.9 C)] 98.3 F (36.8 C) (09/21 0649) Pulse Rate:  [62-97] 62 (09/21 0649) Resp:  [17-19] 17 (09/21 0649) BP: (92-107)/(56-76) 95/61 mmHg (09/21 0649) SpO2:  [92 %-97 %] 94 % (09/21 0649) Last BM Date: 05/11/14 General:   Alert and oriented, flat affect Head:  Normocephalic and atraumatic. Eyes:  No icterus, sclera clear. Conjuctiva pink.  Abdomen:  Bowel sounds present, soft, TTP epigastric region, non-distended. Sitting up in chair, limited exam. Neurologic:  Alert and  oriented x4;  grossly normal neurologically. Psych:  Alert and cooperative.   Intake/Output from previous day: 09/20 0701 - 09/21 0700 In: 1325 [P.O.:480; I.V.:795; IV Piggyback:50] Out: 1625 [Urine:1625] Intake/Output this shift:    Lab Results:  Recent Labs  05/14/14 1336  WBC 9.2  HGB 15.0  HCT 43.1  PLT 250   BMET  Recent Labs  05/14/14 1336 05/15/14 0559 05/16/14 0729  NA 136* 139 141  K 3.6* 4.2 4.4  CL 94* 100 104  CO2 29 29 30   GLUCOSE 95 74 87  BUN 26* 21 13  CREATININE 1.45* 1.21 1.01  CALCIUM 9.4 8.3* 8.0*   LFT  Recent Labs  05/14/14 1336  PROT 7.4  ALBUMIN 3.4*  AST 20  ALT 18  ALKPHOS 59  BILITOT 0.5   Assessment: 58 year old male with history of achalasia s/p Heller's myotomy in past, with recommendations to see Dr. Abran Richard but did not follow through. Recent BPE with findings consistent with achalasia, US abdomen with hepatic steatosis but no evidence of biliary disease. Needs transfer to [facility] for further evaluation; patient has been accepted for transfer. Dr. Oneida Alar to discuss with accepting physician.    Plan: Clear liquids BID PPI Viscous lidocaine Transfer to [facility] today   Orvil Feil, ANP-BC [facility]    LOS: 2 days    05/16/2014, 8:52 AM

## 2014-05-16 NOTE — Progress Notes (Signed)
Pt c/o chest pain this am and that pain medication not taking care of his pain. Notified Dr Cindie Laroche, who ordered to increase frequency of Dilaudid from Q4[facility] to [facility]. No other new orders at this time. Will make day shift nurse aware and will administer Dilaudid as soon as possible. Bed alarm is "on". Bed is in lowest position & call bell is within reach.

## 2014-05-16 NOTE — Progress Notes (Signed)
REVIEWED. AGREE. NO ADDITIONAL RECOMMENDATIONS. 

## 2014-05-18 NOTE — Progress Notes (Signed)
Quick Note:  Patient transferred to [facility] for definitive care. ______

## 2014-05-24 ENCOUNTER — Other Ambulatory Visit: Payer: Self-pay

## 2014-05-25 MED ORDER — LIDOCAINE VISCOUS 2 % MT SOLN: 10.0000 mL | OROMUCOSAL | Status: DC | PRN

## 2014-06-13 NOTE — Care Management Utilization Note (Signed)
UR completed 

## 2014-06-29 ENCOUNTER — Telehealth: Payer: Self-pay | Admitting: Gastroenterology

## 2014-06-29 NOTE — Telephone Encounter (Signed)
PATIENT CALLED WITH QUESTIONS REGARDING HIS SURGERY HE HAS SCHEDULED AT [facility] REGARDING HIS ESOPHAGUS AND STOMACH.  PATIENT IS WORRIED  PLEASE ADVISE [phone]

## 2014-06-29 NOTE — Telephone Encounter (Signed)
PLEASE CALL PT. HE SHOULD CALL [place] WITH ANY RESERVATION.

## 2014-06-29 NOTE — Telephone Encounter (Signed)
Tried to call pt. VM not set up.  

## 2014-07-04 ENCOUNTER — Telehealth: Payer: Self-pay

## 2014-07-04 NOTE — Telephone Encounter (Signed)
Patient called about upcoming surgery with questions   Please advise

## 2014-07-05 NOTE — Telephone Encounter (Signed)
Pt's wife is aware to call surgeon at [facility] with questions in regard to his surgery that is scheduled there for 07/12/2014.

## 2014-07-05 NOTE — Telephone Encounter (Signed)
PT's wife is aware to call surgeon at [facility] with their questions about his upcoming surgery that is scheduledl for 07/12/2014.

## 2014-07-06 ENCOUNTER — Other Ambulatory Visit: Payer: Self-pay

## 2014-07-08 MED ORDER — LIDOCAINE VISCOUS 2 % MT SOLN: 10.0000 mL | OROMUCOSAL | Status: AC | PRN

## 2014-08-26 DEATH — deceased

## 2017-04-02 NOTE — Progress Notes (Signed)
REVIEWED. PT DECEASED.
